# Patient Record
Sex: Female | Born: 1975
Health system: Southern US, Community
[De-identification: ages and names within clinical notes are randomized; demographics above are authoritative.]

## PROBLEM LIST (undated history)

## (undated) DIAGNOSIS — F419 Anxiety disorder, unspecified: Secondary | ICD-10-CM

## (undated) DIAGNOSIS — N200 Calculus of kidney: Secondary | ICD-10-CM

## (undated) DIAGNOSIS — G4730 Sleep apnea, unspecified: Secondary | ICD-10-CM

## (undated) HISTORY — PX: CERVICAL ABLATION: SHX5771

## (undated) HISTORY — PX: FOOT SURGERY: SHX648

## (undated) HISTORY — PX: CHOLECYSTECTOMY: SHX55

## (undated) HISTORY — PX: KIDNEY STONE SURGERY: SHX686

## (undated) HISTORY — DX: Anxiety disorder, unspecified: F41.9

## (undated) HISTORY — DX: Sleep apnea, unspecified: G47.30

## (undated) HISTORY — DX: Calculus of kidney: N20.0

---

## 2001-07-29 ENCOUNTER — Other Ambulatory Visit: Admission: RE | Admit: 2001-07-29 | Discharge: 2001-07-29 | Payer: Self-pay | Admitting: Obstetrics and Gynecology

## 2006-05-08 ENCOUNTER — Emergency Department: Payer: Self-pay | Admitting: Emergency Medicine

## 2006-05-17 ENCOUNTER — Ambulatory Visit: Payer: Self-pay | Admitting: Podiatry

## 2007-11-10 ENCOUNTER — Inpatient Hospital Stay: Payer: Self-pay | Admitting: Unknown Physician Specialty

## 2009-06-18 ENCOUNTER — Emergency Department: Payer: Self-pay | Admitting: Emergency Medicine

## 2012-04-29 HISTORY — PX: TUBAL LIGATION: SHX77

## 2013-05-18 DIAGNOSIS — N938 Other specified abnormal uterine and vaginal bleeding: Secondary | ICD-10-CM | POA: Insufficient documentation

## 2017-04-01 LAB — HM MAMMOGRAPHY: HM Mammogram: NORMAL (ref 0–4)

## 2018-10-08 DIAGNOSIS — Z23 Encounter for immunization: Secondary | ICD-10-CM | POA: Diagnosis not present

## 2018-11-04 ENCOUNTER — Telehealth: Payer: Self-pay | Admitting: Nurse Practitioner

## 2018-11-04 NOTE — Telephone Encounter (Signed)

## 2018-11-05 ENCOUNTER — Encounter: Payer: Self-pay | Admitting: Nurse Practitioner

## 2018-11-05 ENCOUNTER — Other Ambulatory Visit: Payer: Self-pay

## 2018-11-05 ENCOUNTER — Ambulatory Visit (INDEPENDENT_AMBULATORY_CARE_PROVIDER_SITE_OTHER): Payer: BLUE CROSS/BLUE SHIELD | Admitting: Nurse Practitioner

## 2018-11-05 VITALS — BP 112/86 | HR 105 | Temp 97.9°F | Ht 65.0 in | Wt 222.4 lb

## 2018-11-05 DIAGNOSIS — F411 Generalized anxiety disorder: Secondary | ICD-10-CM

## 2018-11-05 MED ORDER — BUSPIRONE HCL 5 MG PO TABS: 5.0000 mg | ORAL_TABLET | Freq: Two times a day (BID) | ORAL | 2 refills | Status: DC

## 2018-11-05 MED ORDER — ESCITALOPRAM OXALATE 10 MG PO TABS: 10.0000 mg | ORAL_TABLET | Freq: Every day | ORAL | 2 refills | Status: DC

## 2018-11-05 NOTE — Progress Notes (Signed)
Subjective:  Patient ID: Regina Schneider, female    DOB: 18-Nov-1975  Age: 43 y.o. MRN: [id number]  CC: Establish Care (est care/Anxiety consult--moved from [place] 3 wks ago--jobs change and alot going on. )  Regina Schneider is here to establish care and to discuss anxiety management. She is married with 2children (74yrs and 41yrs) Last CPE:15months ago Last PAP: 2019 (normal per patient) S/p uterine ablation.  Anxiety Presents for initial visit. Onset was 1 to 5 years ago. The problem has been waxing and waning. Symptoms include confusion, decreased concentration, depressed mood, excessive worry, hyperventilation, insomnia, irritability, malaise, muscle tension, nervous/anxious behavior, palpitations, panic and restlessness. Patient reports no chest pain or suicidal ideas. Symptoms occur most days. The severity of symptoms is causing significant distress and interfering with daily activities. The symptoms are aggravated by family issues and work stress. The quality of sleep is poor. Nighttime awakenings: several, one to two.   Risk factors include marital problems. Her past medical history is significant for anxiety/panic attacks and depression. There is no history of anemia, arrhythmia, asthma, bipolar disorder, CAD, CHF, chronic lung disease, fibromyalgia, hyperthyroidism or suicide attempts. Past treatments include SSRIs, counseling (CBT) and benzodiazephines. The treatment provided significant relief. Compliance with prior treatments has been good.  occasional use of CBD oil at HS Use of alprazolam 0.5mg  prn with moderate relief. Last rx provided by previous pcp in [place] marital discord, asking for separation from her husband. Has a boyfriend. Denies any verbal or physical abuse Started new job as Solicitor. Support system: husband and sister.  Reviewed past Medical, Social and Family history today.  Outpatient Medications Prior to Visit  Medication Sig Dispense Refill  . ALPRAZolam  (XANAX) 0.5 MG tablet Take 0.5 mg by mouth at bedtime as needed for anxiety.     No facility-administered medications prior to visit.    ROS Review of Systems  Constitutional: Positive for irritability and malaise/fatigue. Negative for diaphoresis.  Respiratory: Negative.   Cardiovascular: Positive for palpitations. Negative for chest pain, orthopnea, claudication and leg swelling.  Gastrointestinal: Negative.   Neurological: Negative.   Psychiatric/Behavioral: Positive for confusion, decreased concentration and depression. Negative for hallucinations, memory loss, substance abuse and suicidal ideas. The patient is nervous/anxious and has insomnia.     Objective:  BP 112/86   Pulse (!) 105   Temp 97.9 F (36.6 C) (Tympanic)   Ht 5\' 5"  (1.651 m)   Wt 222 lb 6.4 oz (100.9 kg)   SpO2 96%   BMI 37.01 kg/m   BP Readings from Last 3 Encounters:  11/05/18 112/86    Wt Readings from Last 3 Encounters:  11/05/18 222 lb 6.4 oz (100.9 kg)    Physical Exam Constitutional:      Appearance: She is obese.  Neurological:     Mental Status: She is alert and oriented to person, place, and time.  Psychiatric:        Attention and Perception: Attention normal.        Mood and Affect: Mood is anxious.        Speech: Speech normal.        Behavior: Behavior is cooperative.        Thought Content: Thought content does not include homicidal or suicidal ideation. Thought content does not include homicidal or suicidal plan.        Cognition and Memory: Cognition normal.        Judgment: Judgment normal.    No results found for: WBC, HGB,  HCT, PLT, GLUCOSE, CHOL, TRIG, HDL, LDLDIRECT, LDLCALC, ALT, AST, NA, K, CL, CREATININE, BUN, CO2, TSH, PSA, INR, GLUF, HGBA1C, MICROALBUR  Assessment & Plan:   Regina Schneider was seen today for establish care.  Diagnoses and all orders for this visit:  GAD (generalized anxiety disorder) -     escitalopram (LEXAPRO) 10 MG tablet; Take 1 tablet (10 mg total) by  mouth daily. -     busPIRone (BUSPAR) 5 MG tablet; Take 1 tablet (5 mg total) by mouth 2 (two) times daily.   I am having Regina Schneider. Regina Schneider start on escitalopram and busPIRone. I am also having her maintain her ALPRAZolam.  Meds ordered this encounter  Medications  . escitalopram (LEXAPRO) 10 MG tablet    Sig: Take 1 tablet (10 mg total) by mouth daily.    Dispense:  30 tablet    Refill:  2    Order Specific Question:   Supervising Provider    Answer:   Dianne Dun [[id number]]  . busPIRone (BUSPAR) 5 MG tablet    Sig: Take 1 tablet (5 mg total) by mouth 2 (two) times daily.    Dispense:  30 tablet    Refill:  2    Order Specific Question:   Supervising Provider    Answer:   Dianne Dun [[id number]]    Problem List Items Addressed This Visit    None    Visit Diagnoses    GAD (generalized anxiety disorder)    -  Primary   Relevant Medications   ALPRAZolam (XANAX) 0.5 MG tablet   escitalopram (LEXAPRO) 10 MG tablet   busPIRone (BUSPAR) 5 MG tablet       Follow-up: Return in about 2 weeks (around 11/19/2018) for anxiety ( ).  Alysia Penna, NP

## 2018-11-05 NOTE — Patient Instructions (Addendum)
I instructed pt to start lexapro1/2 tablet once daily for 1 week and then increase to a full tablet once daily on week two as tolerated.   Start buspar 1tab BID We discussed common side effects such as nausea, drowsiness and weight gain.  Also discussed rare but serious side effect of suicide ideation.  She is instructed to discontinue medication go directly to ED if this occurs.  Pt verbalizes understanding.   Plan follow up in 2weeks to evaluate progress.    Schedule appt with EAP counselor as soon as possible.  We will obtain records from previous pcp.  Living With Anxiety  After being diagnosed with an anxiety disorder, you may be relieved to know why you have felt or behaved a certain way. It is natural to also feel overwhelmed about the treatment ahead and what it will mean for your life. With care and support, you can manage this condition and recover from it. How to cope with anxiety Dealing with stress Stress is your body's reaction to life changes and events, both good and bad. Stress can last just a few hours or it can be ongoing. Stress can play a major role in anxiety, so it is important to learn both how to cope with stress and how to think about it differently. Talk with your health care provider or a counselor to learn more about stress reduction. He or she may suggest some stress reduction techniques, such as:  Music therapy. This can include creating or listening to music that you enjoy and that inspires you.  Mindfulness-based meditation. This involves being aware of your normal breaths, rather than trying to control your breathing. It can be done while sitting or walking.  Centering prayer. This is a kind of meditation that involves focusing on a word, phrase, or sacred image that is meaningful to you and that brings you peace.  Deep breathing. To do this, expand your stomach and inhale slowly through your nose. Hold your breath for 3-5 seconds. Then exhale slowly, allowing  your stomach muscles to relax.  Self-talk. This is a skill where you identify thought patterns that lead to anxiety reactions and correct those thoughts.  Muscle relaxation. This involves tensing muscles then relaxing them. Choose a stress reduction technique that fits your lifestyle and personality. Stress reduction techniques take time and practice. Set aside 5-15 minutes a day to do them. Therapists can offer training in these techniques. The training may be covered by some insurance plans. Other things you can do to manage stress include:  Keeping a stress diary. This can help you learn what triggers your stress and ways to control your response.  Thinking about how you respond to certain situations. You may not be able to control everything, but you can control your reaction.  Making time for activities that help you relax, and not feeling guilty about spending your time in this way. Therapy combined with coping and stress-reduction skills provides the best chance for successful treatment. Medicines Medicines can help ease symptoms. Medicines for anxiety include:  Anti-anxiety drugs.  Antidepressants.  Beta-blockers. Medicines may be used as the main treatment for anxiety disorder, along with therapy, or if other treatments are not working. Medicines should be prescribed by a health care provider. Relationships Relationships can play a big part in helping you recover. Try to spend more time connecting with trusted friends and family members. Consider going to couples counseling, taking family education classes, or going to family therapy. Therapy can help you  and others better understand the condition. How to recognize changes in your condition Everyone has a different response to treatment for anxiety. Recovery from anxiety happens when symptoms decrease and stop interfering with your daily activities at home or work. This may mean that you will start to:  Have better concentration  and focus.  Sleep better.  Be less irritable.  Have more energy.  Have improved memory. It is important to recognize when your condition is getting worse. Contact your health care provider if your symptoms interfere with home or work and you do not feel like your condition is improving. Where to find help and support: You can get help and support from these sources:  Self-help groups.  Online and [employer].  A trusted spiritual leader.  Couples counseling.  Family education classes.  Family therapy. Follow these instructions at home:  Eat a healthy diet that includes plenty of vegetables, fruits, whole grains, low-fat dairy products, and lean protein. Do not eat a lot of foods that are high in solid fats, added sugars, or salt.  Exercise. Most adults should do the following: ? Exercise for at least 150 minutes each week. The exercise should increase your heart rate and make you sweat (moderate-intensity exercise). ? Strengthening exercises at least twice a week.  Cut down on caffeine, tobacco, alcohol, and other potentially harmful substances.  Get the right amount and quality of sleep. Most adults need 7-9 hours of sleep each night.  Make choices that simplify your life.  Take over-the-counter and prescription medicines only as told by your health care provider.  Avoid caffeine, alcohol, and certain over-the-counter cold medicines. These may make you feel worse. Ask your pharmacist which medicines to avoid.  Keep all follow-up visits as told by your health care provider. This is important. Questions to ask your health care provider  Would I benefit from therapy?  How often should I follow up with a health care provider?  How long do I need to take medicine?  Are there any long-term side effects of my medicine?  Are there any alternatives to taking medicine? Contact a health care provider if:  You have a hard time staying focused or finishing  daily tasks.  You spend many hours a day feeling worried about everyday life.  You become exhausted by worry.  You start to have headaches, feel tense, or have nausea.  You urinate more than normal.  You have diarrhea. Get help right away if:  You have a racing heart and shortness of breath.  You have thoughts of hurting yourself or others. If you ever feel like you may hurt yourself or others, or have thoughts about taking your own life, get help right away. You can go to your nearest emergency department or call:  Your local emergency services (911 in the [place]  A suicide crisis helpline, such as the [place] at (820)690-4249. This is open 24-hours a day. Summary  Taking steps to deal with stress can help calm you.  Medicines cannot cure anxiety disorders, but they can help ease symptoms.  Family, friends, and partners can play a big part in helping you recover from an anxiety disorder. This information is not intended to replace advice given to you by your health care provider. Make sure you discuss any questions you have with your health care provider. Document Released: 12/13/2015 Document Revised: 11/30/2016 Document Reviewed: 12/13/2015 Elsevier Patient Education  2020 [employer].

## 2018-11-11 ENCOUNTER — Encounter: Payer: Self-pay | Admitting: Nurse Practitioner

## 2018-11-11 DIAGNOSIS — N200 Calculus of kidney: Secondary | ICD-10-CM | POA: Insufficient documentation

## 2018-11-25 ENCOUNTER — Other Ambulatory Visit: Payer: Self-pay

## 2018-11-25 ENCOUNTER — Encounter: Payer: Self-pay | Admitting: Family Medicine

## 2018-11-25 ENCOUNTER — Ambulatory Visit (INDEPENDENT_AMBULATORY_CARE_PROVIDER_SITE_OTHER): Payer: BLUE CROSS/BLUE SHIELD | Admitting: Family Medicine

## 2018-11-25 VITALS — BP 110/88 | HR 88 | Temp 98.6°F | Ht 65.0 in | Wt 219.0 lb

## 2018-11-25 DIAGNOSIS — R300 Dysuria: Secondary | ICD-10-CM | POA: Diagnosis not present

## 2018-11-25 DIAGNOSIS — N3090 Cystitis, unspecified without hematuria: Secondary | ICD-10-CM | POA: Insufficient documentation

## 2018-11-25 LAB — POCT URINALYSIS DIPSTICK
Bilirubin, UA: NEGATIVE
Glucose, UA: NEGATIVE
Ketones, UA: NEGATIVE
Nitrite, UA: NEGATIVE
Protein, UA: POSITIVE — AB
Spec Grav, UA: >=1.03 — AB (ref 1.010–1.025)
Urobilinogen, UA: 0.2 E.U./dL
pH, UA: 6 (ref 5.0–8.0)

## 2018-11-25 MED ORDER — CEPHALEXIN 500 MG PO CAPS: 500.0000 mg | ORAL_CAPSULE | Freq: Two times a day (BID) | ORAL | 0 refills | Status: AC

## 2018-11-25 NOTE — Patient Instructions (Signed)
Urinary Tract Infection, Adult A urinary tract infection (UTI) is an infection of any part of the urinary tract. The urinary tract includes:  The kidneys.  The ureters.  The bladder.  The urethra. These organs make, store, and get rid of pee (urine) in the body. What are the causes? This is caused by germs (bacteria) in your genital area. These germs grow and cause swelling (inflammation) of your urinary tract. What increases the risk? You are more likely to develop this condition if:  You have a small, thin tube (catheter) to drain pee.  You cannot control when you pee or poop (incontinence).  You are female, and: ? You use these methods to prevent pregnancy: ? A medicine that kills sperm (spermicide). ? A device that blocks sperm (diaphragm). ? You have low levels of a female hormone (estrogen). ? You are pregnant.  You have genes that add to your risk.  You are sexually active.  You take antibiotic medicines.  You have trouble peeing because of: ? A prostate that is bigger than normal, if you are female. ? A blockage in the part of your body that drains pee from the bladder (urethra). ? A kidney stone. ? A nerve condition that affects your bladder (neurogenic bladder). ? Not getting enough to drink. ? Not peeing often enough.  You have other conditions, such as: ? Diabetes. ? A weak disease-fighting system (immune system). ? Sickle cell disease. ? Gout. ? Injury of the spine. What are the signs or symptoms? Symptoms of this condition include:  Needing to pee right away (urgently).  Peeing often.  Peeing small amounts often.  Pain or burning when peeing.  Blood in the pee.  Pee that smells bad or not like normal.  Trouble peeing.  Pee that is cloudy.  Fluid coming from the vagina, if you are female.  Pain in the belly or lower back. Other symptoms include:  Throwing up (vomiting).  No urge to eat.  Feeling mixed up (confused).  Being tired  and grouchy (irritable).  A fever.  Watery poop (diarrhea). How is this treated? This condition may be treated with:  Antibiotic medicine.  Other medicines.  Drinking enough water. Follow these instructions at home:  Medicines  Take over-the-counter and prescription medicines only as told by your doctor.  If you were prescribed an antibiotic medicine, take it as told by your doctor. Do not stop taking it even if you start to feel better. General instructions  Make sure you: ? Pee until your bladder is empty. ? Do not hold pee for a long time. ? Empty your bladder after sex. ? Wipe from front to back after pooping if you are a female. Use each tissue one time when you wipe.  Drink enough fluid to keep your pee pale yellow.  Keep all follow-up visits as told by your doctor. This is important. Contact a doctor if:  You do not get better after 1-2 days.  Your symptoms go away and then come back. Get help right away if:  You have very bad back pain.  You have very bad pain in your lower belly.  You have a fever.  You are sick to your stomach (nauseous).  You are throwing up. Summary  A urinary tract infection (UTI) is an infection of any part of the urinary tract.  This condition is caused by germs in your genital area.  There are many risk factors for a UTI. These include having a small, thin   tube to drain pee and not being able to control when you pee or poop.  Treatment includes antibiotic medicines for germs.  Drink enough fluid to keep your pee pale yellow. This information is not intended to replace advice given to you by your health care provider. Make sure you discuss any questions you have with your health care provider. Document Released: 06/06/2007 Document Revised: 12/05/2017 Document Reviewed: 06/27/2017 Elsevier Patient Education  2020 [employer] Inc.  

## 2018-11-25 NOTE — Assessment & Plan Note (Signed)
-  History of kidney stones current history not suggestive of this at this time.  -Start cephalexin 500mg  BID x7 -Urine sent for culture and will adjust antibiotics if needed based on results.  -Instructed to call for any new or worsening symptoms.

## 2018-11-25 NOTE — Progress Notes (Signed)
Regina Schneider - 43 y.o. female MRN [id number]  Date of birth: 02/23/1975  Subjective Chief Complaint  Patient presents with  . Urinary Tract Infection    pt started having symptoms about 5 days ago. Burining and pain after urinating, lower back pain. Hx of kidney stones.    HPI Regina Schneider is a 43 y.o. female with history of renal calculi here today with complaint of dysuria.  She reports that current symptoms started about 5 days ago and include burning and pressure after urination, frequency and urgency.  She has mild low back pain.  She denies flank pain, fever, chills, nausea or gross hematuria.  Reports prior history of lithotripsy but states that this "didn't work".  She has not followed up with urology since having this done.  ROS:  A comprehensive ROS was completed and negative except as noted per HPI  Allergies  Allergen Reactions  . Ciprofloxacin     Hot flashes and abd pain  . Sulfa Antibiotics Hives    Past Medical History:  Diagnosis Date  . Anxiety     Past Surgical History:  Procedure Laterality Date  . CERVICAL ABLATION    . CESAREAN SECTION     2  . CHOLECYSTECTOMY    . FOOT SURGERY Right    metatarsal surygery  . KIDNEY STONE SURGERY    . TUBAL LIGATION Bilateral 04/29/2012    Social History   Socioeconomic History  . Marital status: Unknown    Spouse name: Not on file  . Number of children: Not on file  . Years of education: Not on file  . Highest education level: Not on file  Occupational History  . Not on file  Social Needs  . Financial resource strain: Not on file  . Food insecurity    Worry: Not on file    Inability: Not on file  . Transportation needs    Medical: Not on file    Non-medical: Not on file  Tobacco Use  . Smoking status: Current Some Day Smoker    Types: Cigarettes  . Smokeless tobacco: Never Used  . Tobacco comment: 3-4 cigarettes a day  Substance and Sexual Activity  . Alcohol use: Yes    Alcohol/week: 1.0  standard drinks    Types: 1 Glasses of wine per week    Comment: twice a week  . Drug use: Never  . Sexual activity: Not on file  Lifestyle  . Physical activity    Days per week: Not on file    Minutes per session: Not on file  . Stress: Not on file  Relationships  . Social Musician on phone: Not on file    Gets together: Not on file    Attends religious service: Not on file    Active member of club or organization: Not on file    Attends meetings of clubs or organizations: Not on file    Relationship status: Not on file  Other Topics Concern  . Not on file  Social History Narrative  . Not on file    Family History  Problem Relation Age of Onset  . COPD Mother   . Dementia Mother   . Cancer Father        lung and brain stem  . COPD Father   . Cancer Maternal Grandmother        breast cancer, 50  . Diabetes Paternal Grandmother     Health Maintenance  Topic Date Due  .  HIV Screening  04/14/1988  . TETANUS/TDAP  04/14/1992  . PAP SMEAR-Modifier  04/15/1994  . INFLUENZA VACCINE  Completed    ----------------------------------------------------------------------------------------------------------------------------------------------------------------------------------------------------------------- Physical Exam BP 110/88   Pulse 88   Temp 98.6 F (37 C) (Temporal)   Ht 5\' 5"  (1.651 m)   Wt 219 lb (99.3 kg)   SpO2 96%   BMI 36.44 kg/m   Physical Exam Constitutional:      Appearance: Normal appearance.  HENT:     Head: Normocephalic and atraumatic.  Eyes:     General: No scleral icterus. Cardiovascular:     Rate and Rhythm: Normal rate and regular rhythm.  Pulmonary:     Effort: Pulmonary effort is normal.     Breath sounds: Normal breath sounds.  Abdominal:     General: Abdomen is flat. There is no distension.     Palpations: Abdomen is soft.     Tenderness: There is no abdominal tenderness. There is no right CVA tenderness or left CVA  tenderness.  Skin:    General: Skin is warm and dry.  Neurological:     General: No focal deficit present.     Mental Status: She is alert.  Psychiatric:        Mood and Affect: Mood normal.        Behavior: Behavior normal.     ------------------------------------------------------------------------------------------------------------------------------------------------------------------------------------------------------------------- Assessment and Plan  Cystitis -History of kidney stones current history not suggestive of this at this time.  -Start cephalexin 500mg  BID x7 -Urine sent for culture and will adjust antibiotics if needed based on results.  -Instructed to call for any new or worsening symptoms.   This visit occurred during the SARS-CoV-2 public health emergency.  Safety protocols were in place, including screening questions prior to the visit, additional usage of staff PPE, and extensive cleaning of exam room while observing appropriate contact time as indicated for disinfecting solutions.

## 2018-11-27 LAB — URINE CULTURE
MICRO NUMBER:: 1134694
SPECIMEN QUALITY:: ADEQUATE

## 2019-02-24 ENCOUNTER — Telehealth (INDEPENDENT_AMBULATORY_CARE_PROVIDER_SITE_OTHER): Payer: 59 | Admitting: Nurse Practitioner

## 2019-02-24 ENCOUNTER — Encounter: Payer: Self-pay | Admitting: Nurse Practitioner

## 2019-02-24 VITALS — Ht 65.0 in | Wt 220.0 lb

## 2019-02-24 DIAGNOSIS — Z6836 Body mass index (BMI) 36.0-36.9, adult: Secondary | ICD-10-CM

## 2019-02-24 DIAGNOSIS — K2100 Gastro-esophageal reflux disease with esophagitis, without bleeding: Secondary | ICD-10-CM

## 2019-02-24 DIAGNOSIS — F411 Generalized anxiety disorder: Secondary | ICD-10-CM | POA: Diagnosis not present

## 2019-02-24 DIAGNOSIS — E6609 Other obesity due to excess calories: Secondary | ICD-10-CM | POA: Insufficient documentation

## 2019-02-24 MED ORDER — OMEPRAZOLE 20 MG PO CPDR: 20.0000 mg | DELAYED_RELEASE_CAPSULE | Freq: Two times a day (BID) | ORAL | 0 refills | Status: DC

## 2019-02-24 NOTE — Patient Instructions (Signed)
Call office if no improvement in 2weeks.  Food Choices for Gastroesophageal Reflux Disease, Adult When you have gastroesophageal reflux disease (GERD), the foods you eat and your eating habits are very important. Choosing the right foods can help ease your discomfort. Think about working with a nutrition specialist (dietitian) to help you make good choices. What are tips for following this plan?  Meals  Choose healthy foods that are low in fat, such as fruits, vegetables, whole grains, low-fat dairy products, and lean meat, fish, and poultry.  Eat small meals often instead of 3 large meals a day. Eat your meals slowly, and in a place where you are relaxed. Avoid bending over or lying down until 2-3 hours after eating.  Avoid eating meals 2-3 hours before bed.  Avoid drinking a lot of liquid with meals.  Cook foods using methods other than frying. Bake, grill, or broil food instead.  Avoid or limit: ? Chocolate. ? Peppermint or spearmint. ? Alcohol. ? Pepper. ? Black and decaffeinated coffee. ? Black and decaffeinated tea. ? Bubbly (carbonated) soft drinks. ? Caffeinated energy drinks and soft drinks.  Limit high-fat foods such as: ? Fatty meat or fried foods. ? Whole milk, cream, butter, or ice cream. ? Nuts and nut butters. ? Pastries, donuts, and sweets made with butter or shortening.  Avoid foods that cause symptoms. These foods may be different for everyone. Common foods that cause symptoms include: ? Tomatoes. ? Oranges, lemons, and limes. ? Peppers. ? Spicy food. ? Onions and garlic. ? Vinegar. Lifestyle  Maintain a healthy weight. Ask your doctor what weight is healthy for you. If you need to lose weight, work with your doctor to do so safely.  Exercise for at least 30 minutes for 5 or more days each week, or as told by your doctor.  Wear loose-fitting clothes.  Do not smoke. If you need help quitting, ask your doctor.  Sleep with the head of your bed higher  than your feet. Use a wedge under the mattress or blocks under the bed frame to raise the head of the bed. Summary  When you have gastroesophageal reflux disease (GERD), food and lifestyle choices are very important in easing your symptoms.  Eat small meals often instead of 3 large meals a day. Eat your meals slowly, and in a place where you are relaxed.  Limit high-fat foods such as fatty meat or fried foods.  Avoid bending over or lying down until 2-3 hours after eating.  Avoid peppermint and spearmint, caffeine, alcohol, and chocolate. This information is not intended to replace advice given to you by your health care provider. Make sure you discuss any questions you have with your health care provider. Document Revised: 04/10/2018 Document Reviewed: 01/24/2016 Elsevier Patient Education  2020 [employer].

## 2019-02-24 NOTE — Assessment & Plan Note (Signed)
Ongoing CBT with therapist through [facility], sessions 1-2x/week Stable mood, states she no longer needs lexapro and buspar. Use of alprazolam 0.5mg  prn, Rx from previous pcp in [place], last filled 09/2018, #30tabs. I advised her that I do not recommend long term use of benzodiazepine. I recommended use of buspar prn and/or resuming lexapro if mood worsens. F/up in 67month

## 2019-02-24 NOTE — Progress Notes (Signed)
Virtual Visit via Video Note  I connected with@ on 02/24/19 at 12:30 PM EST by a video enabled telemedicine application and verified that I am speaking with the correct person using two identifiers.  Location: Patient:Home Provider: Office Participants: patient and provider   I discussed the limitations of evaluation and management by telemedicine and the availability of in person appointments. I also discussed with the patient that there may be a patient responsible charge related to this service. The patient expressed understanding and agreed to proceed.  [id number]  History of Present Illness: Gastroesophageal Reflux She complains of abdominal pain, globus sensation, heartburn and nausea. She reports no belching, no chest pain, no choking, no coughing, no dysphagia, no hoarse voice, no sore throat, no stridor, no tooth decay, no water brash or no wheezing. This is a new problem. The current episode started 1 to 4 weeks ago. The problem occurs constantly. The problem has been unchanged. The heartburn is located in the substernum. The heartburn does not wake her from sleep. The heartburn does not limit her activity. The heartburn doesn't change with position. The symptoms are aggravated by certain foods and stress. Pertinent negatives include no anemia, fatigue, melena, muscle weakness, orthopnea or weight loss. Risk factors include obesity. She has tried a diet change for the symptoms. The treatment provided mild relief.  S/p cholecystectomy 36yrs  She is also requesting referral to weight loss clinic. Previous use of phentermine last year, lost 20lbs but unable to tolerate side effects (insomnia and increased anxiety).   Observations/Objective: Unable to provide any vital signs Physical Exam  Constitutional: She is oriented to person, place, and time. No distress.  Pulmonary/Chest: Effort normal.  Neurological: She is alert and oriented to person, place, and time.   Assessment and  Plan: Diagnoses and all orders for this visit:  Gastroesophageal reflux disease with esophagitis without hemorrhage -     omeprazole (PRILOSEC) 20 MG capsule; Take 1 capsule (20 mg total) by mouth 2 (two) times daily before a meal.  Class 2 severe obesity due to excess calories with serious comorbidity and body mass index (BMI) of 36.0 to 36.9 in adult (HCC) -     Amb Ref to Medical Weight Management  GAD (generalized anxiety disorder)   Follow Up Instructions: See avs   I discussed the assessment and treatment plan with the patient. The patient was provided an opportunity to ask questions and all were answered. The patient agreed with the plan and demonstrated an understanding of the instructions.   The patient was advised to call back or seek an in-person evaluation if the symptoms worsen or if the condition fails to improve as anticipated.  Alysia Penna, NP

## 2019-03-04 ENCOUNTER — Ambulatory Visit: Payer: 59 | Admitting: Dietician

## 2019-05-02 LAB — HM PAP SMEAR: HM Pap smear: NORMAL

## 2019-06-19 ENCOUNTER — Other Ambulatory Visit: Payer: Self-pay | Admitting: Emergency Medicine

## 2019-06-19 ENCOUNTER — Ambulatory Visit
Admission: RE | Admit: 2019-06-19 | Discharge: 2019-06-19 | Disposition: A | Discharge status: Home / Self Care | Payer: BC Managed Care – PPO | Source: Ambulatory Visit | Attending: Emergency Medicine | Admitting: Emergency Medicine

## 2019-06-19 ENCOUNTER — Ambulatory Visit
Admission: RE | Admit: 2019-06-19 | Discharge: 2019-06-19 | Disposition: A | Discharge status: Home / Self Care | Payer: Worker's Compensation | Attending: Emergency Medicine | Admitting: Emergency Medicine

## 2019-06-19 DIAGNOSIS — W109XXS Fall (on) (from) unspecified stairs and steps, sequela: Secondary | ICD-10-CM

## 2019-06-19 DIAGNOSIS — Y990 Civilian activity done for income or pay: Secondary | ICD-10-CM | POA: Diagnosis not present

## 2019-06-19 DIAGNOSIS — W109XXA Fall (on) (from) unspecified stairs and steps, initial encounter: Secondary | ICD-10-CM | POA: Insufficient documentation

## 2019-06-19 DIAGNOSIS — S42295A Other nondisplaced fracture of upper end of left humerus, initial encounter for closed fracture: Secondary | ICD-10-CM | POA: Insufficient documentation

## 2019-06-19 DIAGNOSIS — G8911 Acute pain due to trauma: Secondary | ICD-10-CM | POA: Diagnosis not present

## 2019-06-21 ENCOUNTER — Emergency Department: Admission: EM | Admit: 2019-06-21 | Discharge: 2019-06-21 | Discharge status: Against Medical Advice | Payer: Self-pay

## 2019-06-24 ENCOUNTER — Other Ambulatory Visit: Payer: Self-pay

## 2019-06-24 ENCOUNTER — Ambulatory Visit: Admission: RE | Admit: 2019-06-24 | Discharge status: Absent without leave | Payer: Self-pay | Source: Ambulatory Visit

## 2019-06-24 ENCOUNTER — Ambulatory Visit
Admission: EM | Admit: 2019-06-24 | Discharge: 2019-06-24 | Disposition: A | Discharge status: Home / Self Care | Payer: BC Managed Care – PPO | Attending: Emergency Medicine | Admitting: Emergency Medicine

## 2019-06-24 DIAGNOSIS — R300 Dysuria: Secondary | ICD-10-CM | POA: Diagnosis not present

## 2019-06-24 LAB — POCT URINALYSIS DIP (MANUAL ENTRY)
Bilirubin, UA: NEGATIVE
Glucose, UA: NEGATIVE mg/dL
Ketones, POC UA: NEGATIVE mg/dL
Nitrite, UA: NEGATIVE
Protein Ur, POC: 100 mg/dL — AB
Spec Grav, UA: >=1.03 — AB (ref 1.010–1.025)
Urobilinogen, UA: 1 E.U./dL
pH, UA: 6 (ref 5.0–8.0)

## 2019-06-24 MED ORDER — CEPHALEXIN 500 MG PO CAPS: 500.0000 mg | ORAL_CAPSULE | Freq: Two times a day (BID) | ORAL | 0 refills | Status: AC

## 2019-06-24 NOTE — Discharge Instructions (Signed)
A urine culture is pending.  We will call you if your antibiotic needs to be changed or discontinued.    Take the cephalexin as directed.    Follow-up with your primary care provider if your symptoms are not improving.

## 2019-06-24 NOTE — ED Triage Notes (Signed)
Patient states that she fell last week on Friday and broke her arm. Patient states that she started having Urinary frequency, dysuria and back pain that started 1 day after she fell. States that she is on Tylenol with Codeine right now and she is not able to get her pain under control with this. States that she has had a kidney stone for a while that was unsuccessful with lithotripsy and she thinks when she fell she dislodged it.

## 2019-06-24 NOTE — ED Provider Notes (Signed)
Roderic Palau    CSN: [id number] Arrival date & time: 06/24/19  1707      History   Chief Complaint Chief Complaint  Patient presents with  . Urinary Frequency    HPI ANALAYAH BROOKE is a 44 y.o. female.   Patient presents with 1 week history of dysuria, frequency, urgency.  Patient states she has a history of kidney stones and is concerned that she may have "dislodged one" when she fell last week.  She states she broke her arm when she fell and is taking Tylenol with codeine.  She states the Tylenol with codeine is not relieving her urinary discomfort.  She denies fever, chills, abdominal pain, vaginal discharge, pelvic pain, or other symptoms.  The history is provided by the patient.    Past Medical History:  Diagnosis Date  . Anxiety     Patient Active Problem List   Diagnosis Date Noted  . GAD (generalized anxiety disorder) 02/24/2019  . Class 2 severe obesity due to excess calories with serious comorbidity and body mass index (BMI) of 36.0 to 36.9 in adult [facility]) 02/24/2019  . Cystitis 11/25/2018  . Renal calculi 11/11/2018  . DUB (dysfunctional uterine bleeding) 05/18/2013    Past Surgical History:  Procedure Laterality Date  . CERVICAL ABLATION    . CESAREAN SECTION     2  . CHOLECYSTECTOMY    . FOOT SURGERY Right    metatarsal surygery  . KIDNEY STONE SURGERY    . TUBAL LIGATION Bilateral 04/29/2012    OB History   No obstetric history on file.      Home Medications    Prior to Admission medications   Medication Sig Start Date End Date Taking? Authorizing Provider  acetaminophen-codeine (TYLENOL #4) 300-60 MG tablet Take 1 tablet by mouth every 4 (four) hours as needed for pain.   Yes [provider]  valACYclovir (VALTREX) 1000 MG tablet To take BID x 7 days as needed 07/23/18  Yes [provider]  ALPRAZolam Duanne Moron) 0.5 MG tablet Take 0.5 mg by mouth at bedtime as needed for anxiety.    [provider]    cephALEXin (KEFLEX) 500 MG capsule Take 1 capsule (500 mg total) by mouth 2 (two) times daily for 5 days. 06/24/19 06/29/19  Sharion Balloon, NP  omeprazole (PRILOSEC) 20 MG capsule Take 1 capsule (20 mg total) by mouth 2 (two) times daily before a meal. 02/24/19   Nche, Charlene Brooke, NP    Family History Family History  Problem Relation Age of Onset  . COPD Mother   . Dementia Mother   . Cancer Father        lung and brain stem  . COPD Father   . Cancer Maternal Grandmother        breast cancer, 6  . Diabetes Paternal Grandmother     Social History Social History   Tobacco Use  . Smoking status: Former Smoker    Types: Cigarettes    Quit date: 12/02/2018    Years since quitting: 0.5  . Smokeless tobacco: Never Used  . Tobacco comment: 3-4 cigarettes a day  Vaping Use  . Vaping Use: Never used  Substance Use Topics  . Alcohol use: Yes    Alcohol/week: 1.0 standard drink    Types: 1 Glasses of wine per week    Comment: twice a week  . Drug use: Never     Allergies   Ciprofloxacin and Sulfa antibiotics   Review of  Systems Review of Systems  Constitutional: Negative for chills and fever.  HENT: Negative for ear pain and sore throat.   Eyes: Negative for pain and visual disturbance.  Respiratory: Negative for cough and shortness of breath.   Cardiovascular: Negative for chest pain and palpitations.  Gastrointestinal: Negative for abdominal pain, diarrhea, nausea and vomiting.  Genitourinary: Positive for dysuria, frequency and urgency. Negative for hematuria.  Musculoskeletal: Negative for gait problem and neck pain.  Skin: Negative for color change and rash.  Neurological: Negative for seizures and syncope.  All other systems reviewed and are negative.    Physical Exam Triage Vital Signs ED Triage Vitals [06/24/19 1725]  Enc Vitals Group     BP      Pulse      Resp      Temp      Temp src      SpO2      Weight 210 lb (95.3 kg)     Height 5\' 5"  (1.651 m)      Head Circumference      Peak Flow      Pain Score 9     Pain Loc      Pain Edu?      Excl. in GC?    No data found.  Updated Vital Signs BP 133/81 (BP Location: Right Arm)   Pulse 97   Temp 98.6 F (37 C) (Oral)   Resp 18   Ht 5\' 5"  (1.651 m)   Wt 210 lb (95.3 kg)   SpO2 98%   BMI 34.95 kg/m   Visual Acuity Right Eye Distance:   Left Eye Distance:   Bilateral Distance:    Right Eye Near:   Left Eye Near:    Bilateral Near:     Physical Exam Vitals and nursing note reviewed.  Constitutional:      General: She is not in acute distress.    Appearance: She is well-developed. She is not ill-appearing.  HENT:     Head: Normocephalic and atraumatic.     Mouth/Throat:     Mouth: Mucous membranes are moist.  Eyes:     Conjunctiva/sclera: Conjunctivae normal.  Cardiovascular:     Rate and Rhythm: Normal rate and regular rhythm.     Heart sounds: No murmur heard.   Pulmonary:     Effort: Pulmonary effort is normal. No respiratory distress.     Breath sounds: Normal breath sounds.  Abdominal:     Palpations: Abdomen is soft.     Tenderness: There is no abdominal tenderness. There is no right CVA tenderness, left CVA tenderness, guarding or rebound.  Musculoskeletal:     Cervical back: Neck supple.  Skin:    General: Skin is warm and dry.     Findings: No rash.  Neurological:     General: No focal deficit present.     Mental Status: She is alert and oriented to person, place, and time.     Gait: Gait normal.  Psychiatric:        Mood and Affect: Mood normal.        Behavior: Behavior normal.      UC Treatments / Results  Labs (all labs ordered are listed, but only abnormal results are displayed) Labs Reviewed  POCT URINALYSIS DIP (MANUAL ENTRY) - Abnormal; Notable for the following components:      Result Value   Clarity, UA cloudy (*)    Spec Grav, UA >=1.030 (*)    Blood, UA large (*)  Protein Ur, POC =100 (*)    Leukocytes, UA Small (1+) (*)     All other components within normal limits  URINE CULTURE    EKG   Radiology No results found.  Procedures Procedures (including critical care time)  Medications Ordered in UC Medications - No data to display  Initial Impression / Assessment and Plan / UC Course  I have reviewed the triage vital signs and the nursing notes.  Pertinent labs & imaging results that were available during my care of the patient were reviewed by me and considered in my medical decision making (see chart for details).   Dysuria.  Urine culture pending.  Treating with Keflex.  Instructed patient that we will call her if her antibiotic needs to be changed or discontinued.  Instructed her to follow-up with her PCP if her symptoms or not improving.  Instructed her to go to the ED if she has uncontrolled pain or worsening symptoms.  Patient agrees to plan of care.     Final Clinical Impressions(s) / UC Diagnoses   Final diagnoses:  Dysuria     Discharge Instructions     A urine culture is pending.  We will call you if your antibiotic needs to be changed or discontinued.    Take the cephalexin as directed.    Follow-up with your primary care provider if your symptoms are not improving.        ED Prescriptions    Medication Sig Dispense Auth. Provider   cephALEXin (KEFLEX) 500 MG capsule Take 1 capsule (500 mg total) by mouth 2 (two) times daily for 5 days. 10 capsule Mickie Bail, NP     I have reviewed the PDMP during this encounter.   Mickie Bail, NP 06/24/19 1758

## 2019-06-26 LAB — URINE CULTURE

## 2019-07-01 ENCOUNTER — Ambulatory Visit (INDEPENDENT_AMBULATORY_CARE_PROVIDER_SITE_OTHER): Payer: BC Managed Care – PPO | Admitting: Family Medicine

## 2019-07-01 ENCOUNTER — Encounter: Payer: Self-pay | Admitting: Family Medicine

## 2019-07-01 ENCOUNTER — Other Ambulatory Visit: Payer: Self-pay

## 2019-07-01 VITALS — BP 113/68 | HR 91 | Temp 98.6°F | Ht 65.0 in | Wt 227.0 lb

## 2019-07-01 DIAGNOSIS — G4730 Sleep apnea, unspecified: Secondary | ICD-10-CM | POA: Insufficient documentation

## 2019-07-01 DIAGNOSIS — Z6837 Body mass index (BMI) 37.0-37.9, adult: Secondary | ICD-10-CM

## 2019-07-01 DIAGNOSIS — Z7689 Persons encountering health services in other specified circumstances: Secondary | ICD-10-CM | POA: Insufficient documentation

## 2019-07-01 DIAGNOSIS — F419 Anxiety disorder, unspecified: Secondary | ICD-10-CM | POA: Insufficient documentation

## 2019-07-01 NOTE — Assessment & Plan Note (Addendum)
New patient establishment at [facility]  Will f/u 4 weeks for CPE and labs

## 2019-07-01 NOTE — Assessment & Plan Note (Signed)
Reported history of OSA, requesting to work on weight loss before having to have repeat sleep study with CPAP.  Plan to lose 30-50lbs and re-evaluate OSA.

## 2019-07-01 NOTE — Patient Instructions (Signed)
A referral to OB/GYN, [facility], Dr. Tiburcio Pea, has been placed today.  If you have not heard from the specialty office or our referral coordinator within 1 week, please let us know and we will follow up with the referral coordinator for an update.  We will plan to see you back in 4 weeks for physical  You will receive a survey after today's visit either digitally by e-mail or paper by USPS mail. Your experiences and feedback matter to [place].  Please respond so we know how we are doing as we provide care for you.  Call us with any questions/concerns/needs.  It is my goal to be available to you for your health concerns.  Thanks for choosing me to be a partner in your healthcare needs!  Charlaine Dalton, FNP-C Family Nurse Practitioner [facility] Phone: [phone]

## 2019-07-01 NOTE — Assessment & Plan Note (Signed)
Discussed weight management options within our clinic, such as Gwenith Daily, Topamax, Saxenda, and Victoza.  Patient has had previous weight loss with phenteramine and discussed we do not prescribe that in our clinic.  Discussed can discuss with her OB/GYN at her re-establish visit and if is not part of their practice, we can submit a prescription for Contrave for her.  Assisted with looking into [facility] for weight loss support and forming healthy relationship with food.  Patient agreeable to plan.

## 2019-07-01 NOTE — Progress Notes (Signed)
Subjective:    Patient ID: Regina Schneider, female    DOB: [birth year], 44 y.o.   MRN: [id number]  Regina Schneider is a 44 y.o. female presenting on 07/01/2019 for Establish Care and Weight Loss   HPI  Previous PCP was at [facility].  Records will not be requested, as are in CareEverywhere.  Past medical, family, and surgical history reviewed w/ pt.  Ms. Stratmann presents to clinic for establishment of care.  Has acute concerns today to discuss weight loss needs.  Had previously taken phenteramine and is looking to restart.  Has recently had a left humeral fracture, that is causing her to be less mobile and is looking to jump start weight loss.  Is also requesting referral to re-establish with previous OB/GYN with [facility].    Depression screen PHQ 2/9 11/05/2018  Decreased Interest 2  Down, Depressed, Hopeless 3  PHQ - 2 Score 5  Altered sleeping 3  Tired, decreased energy 2  Change in appetite 2  Feeling bad or failure about yourself  3  Trouble concentrating 3  Moving slowly or fidgety/restless 3  Suicidal thoughts 0  PHQ-9 Score 21    Social History   Tobacco Use  . Smoking status: Former Smoker    Types: Cigarettes    Quit date: 12/02/2018    Years since quitting: 0.5  . Smokeless tobacco: Never Used  . Tobacco comment: 3-4 cigarettes a day  Vaping Use  . Vaping Use: Never used  Substance Use Topics  . Alcohol use: Yes    Alcohol/week: 1.0 standard drink    Types: 1 Glasses of wine per week    Comment: twice a week  . Drug use: Never    Review of Systems  Constitutional: Negative.   HENT: Negative.   Eyes: Negative.   Respiratory: Negative.   Cardiovascular: Negative.   Gastrointestinal: Negative.   Endocrine: Negative.   Genitourinary: Negative.   Musculoskeletal: Negative.   Skin: Negative.   Allergic/Immunologic: Negative.   Neurological: Negative.   Hematological: Negative.   Psychiatric/Behavioral: Negative.    Per HPI unless  specifically indicated above     Objective:    BP 113/68   Pulse 91   Temp 98.6 F (37 C) (Oral)   Ht 5\' 5"  (1.651 m)   Wt 227 lb (103 kg)   SpO2 98%   BMI 37.77 kg/m   Wt Readings from Last 3 Encounters:  07/01/19 227 lb (103 kg)  06/24/19 210 lb (95.3 kg)  02/24/19 220 lb (99.8 kg)    Physical Exam Vitals reviewed.  Constitutional:      General: She is not in acute distress.    Appearance: Normal appearance. She is well-developed and well-groomed. She is obese. She is not ill-appearing or toxic-appearing.  HENT:     Head: Normocephalic and atraumatic.     Nose:     Comments: 02/26/19 is in place, covering mouth and nose. Eyes:     General: Lids are normal. Vision grossly intact.        Right eye: No discharge.        Left eye: No discharge.     Extraocular Movements: Extraocular movements intact.     Conjunctiva/sclera: Conjunctivae normal.     Pupils: Pupils are equal, round, and reactive to light.  Cardiovascular:     Rate and Rhythm: Normal rate and regular rhythm.     Pulses: Normal pulses.  Dorsalis pedis pulses are 2+ on the right side and 2+ on the left side.     Heart sounds: Normal heart sounds. No murmur heard.  No friction rub. No gallop.   Pulmonary:     Effort: Pulmonary effort is normal. No respiratory distress.     Breath sounds: Normal breath sounds.  Musculoskeletal:     Right lower leg: No edema.     Left lower leg: No edema.     Comments: Left arm in immobilizer  Skin:    General: Skin is warm and dry.     Capillary Refill: Capillary refill takes less than 2 seconds.  Neurological:     General: No focal deficit present.     Mental Status: She is alert and oriented to person, place, and time.  Psychiatric:        Attention and Perception: Attention and perception normal.        Mood and Affect: Mood and affect normal.        Speech: Speech normal.        Behavior: Behavior normal. Behavior is cooperative.        Thought Content:  Thought content normal.        Cognition and Memory: Cognition and memory normal.        Judgment: Judgment normal.    Results for orders placed or performed in visit on 07/01/19  HM MAMMOGRAPHY  Result Value Ref Range   HM Mammogram Self Reported Normal 0-4 Bi-Rad, Self Reported Normal  HM PAP SMEAR  Result Value Ref Range   HM Pap smear self reported normal       Assessment & Plan:   Problem List Items Addressed This Visit      Respiratory   Sleep apnea    Reported history of OSA, requesting to work on weight loss before having to have repeat sleep study with CPAP.  Plan to lose 30-50lbs and re-evaluate OSA.        Other   Encounter to establish care with new doctor - Primary    New patient establishment at [facility]  Will f/u 4 weeks for CPE and labs       Relevant Orders   Ambulatory referral to Obstetrics / Gynecology   BMI 37.0-37.9, adult    Discussed weight management options within our clinic, such as Gwenith Daily, Topamax, Saxenda, and Victoza.  Patient has had previous weight loss with phenteramine and discussed we do not prescribe that in our clinic.  Discussed can discuss with her OB/GYN at her re-establish visit and if is not part of their practice, we can submit a prescription for Contrave for her.  Assisted with looking into [facility] for weight loss support and forming healthy relationship with food.  Patient agreeable to plan.          No orders of the defined types were placed in this encounter.     Follow up plan: Return in about 4 weeks (around 07/29/2019) for CPE.   Charlaine Dalton, FNP Family Nurse Practitioner [facility] 07/01/2019, 10:59 AM

## 2019-07-02 ENCOUNTER — Telehealth: Payer: Self-pay | Admitting: Obstetrics & Gynecology

## 2019-07-02 NOTE — Telephone Encounter (Signed)
Regina Schneider Medical referring for Relocated back to area, looking to re-establish with [facility]. Called and left voicemail for patient to call back to be scheduled.

## 2019-07-08 ENCOUNTER — Telehealth: Payer: Self-pay

## 2019-07-08 NOTE — Telephone Encounter (Signed)
Copied from CRM [id number]. Topic: General - Inquiry >> Jul 08, 2019  1:34 PM Regina Schneider wrote: Reason for CRM: Pt stated that she seen her PCP [facility] about a week ago, spoke of dieting plans, PT stated that she wanted  to see about using the other RX that was talked about on last visit   I spoke with the patient and she informed me that she would like  to try the contrave, because she have taking the phentermine in the past and it makes her jittery. She is still planning to keep her appt with Dr. Tiburcio Pea for  GYN concerns only.

## 2019-07-09 ENCOUNTER — Other Ambulatory Visit: Payer: Self-pay | Admitting: Family Medicine

## 2019-07-09 DIAGNOSIS — Z6837 Body mass index (BMI) 37.0-37.9, adult: Secondary | ICD-10-CM

## 2019-07-09 MED ORDER — NALTREXONE-BUPROPION HCL ER 8-90 MG PO TB12: ORAL_TABLET | ORAL | 0 refills | Status: DC

## 2019-07-09 NOTE — Progress Notes (Signed)
Patient requesting Contrave Rx - will send in rx to pharmacy on file

## 2019-07-09 NOTE — Telephone Encounter (Signed)
She want to have you to manage her weight loss. She said she will still keep her appt with Dr. Tiburcio Pea for GYN consult only.

## 2019-07-09 NOTE — Telephone Encounter (Signed)
When we met, patient was interested in re-establishing with Dr. Harris and discussing about starting back on phenteramine since we do not prescribe in our clinic.  She has an appt with him on 08/04/19.  We spoke about sending in a prescription for contrave.  She would need to decide who would be the provider who was working on her weight management before I sent in a prescription. 

## 2019-07-09 NOTE — Telephone Encounter (Signed)
Can you find out if she wants to have weight loss managed here in primary care or with Dr. Tiburcio Pea?  She has an appt with him 08/04/19

## 2019-07-17 ENCOUNTER — Encounter: Payer: Self-pay | Admitting: Family Medicine

## 2019-07-17 ENCOUNTER — Other Ambulatory Visit: Payer: Self-pay

## 2019-07-17 ENCOUNTER — Ambulatory Visit (INDEPENDENT_AMBULATORY_CARE_PROVIDER_SITE_OTHER): Payer: BC Managed Care – PPO | Admitting: Family Medicine

## 2019-07-17 VITALS — BP 130/62 | HR 88 | Temp 98.2°F | Resp 16 | Ht 65.0 in | Wt 226.6 lb

## 2019-07-17 DIAGNOSIS — N200 Calculus of kidney: Secondary | ICD-10-CM

## 2019-07-17 DIAGNOSIS — N3001 Acute cystitis with hematuria: Secondary | ICD-10-CM | POA: Diagnosis not present

## 2019-07-17 DIAGNOSIS — R319 Hematuria, unspecified: Secondary | ICD-10-CM | POA: Diagnosis not present

## 2019-07-17 DIAGNOSIS — N390 Urinary tract infection, site not specified: Secondary | ICD-10-CM | POA: Diagnosis not present

## 2019-07-17 DIAGNOSIS — N23 Unspecified renal colic: Secondary | ICD-10-CM | POA: Diagnosis not present

## 2019-07-17 LAB — POCT URINALYSIS DIPSTICK
Bilirubin, UA: NEGATIVE
Glucose, UA: NEGATIVE
Ketones, UA: NEGATIVE
Nitrite, UA: POSITIVE
Protein, UA: POSITIVE — AB
Spec Grav, UA: 1.02 (ref 1.010–1.025)
Urobilinogen, UA: 0.2 E.U./dL
pH, UA: 5 (ref 5.0–8.0)

## 2019-07-17 MED ORDER — CEPHALEXIN 500 MG PO CAPS: 500.0000 mg | ORAL_CAPSULE | Freq: Three times a day (TID) | ORAL | 0 refills | Status: DC

## 2019-07-17 MED ORDER — CYCLOBENZAPRINE HCL 10 MG PO TABS: 10.0000 mg | ORAL_TABLET | Freq: Three times a day (TID) | ORAL | 0 refills | Status: DC | PRN

## 2019-07-17 NOTE — Patient Instructions (Addendum)
Thank you for coming to the office today.  You most likely have a Kidney Stone (Nephrolithiasis)  It can cause flank or side pain, abdominal pain, or pain with urination. Also blood in urine is very common.  You have been referred to Urology  Very important to see Urologist as scheduled and discuss removal options and they may repeat imaging  If you get any of the following it is more of an emergency and may need to go to hospital directly or we can try to contact Urology sooner  - Fever, chills sweats nausea vomiting cannot tolerate antibiotic - Cannot pee or void at all for 12 hours straight, despite straining and medicine  I am concerned about possible UTI or bladder infection, you do not have symptoms of a kidney infection at this time.  We will send urine for culture - stay tuned for results within 48 hours or so, if we need to adjust antibiotic  Start Keflex antibiotic 3 times daily for 7 days - finish entire course  [facility] -1st floor [address] [place],  [place]  [address] Phone: [phone]     Please schedule a Follow-up Appointment to: Return if symptoms worsen or fail to improve.  If you have any other questions or concerns, please feel free to call the office or send a message through MyChart. You may also schedule an earlier appointment if necessary.  Additionally, you may be receiving a survey about your experience at our office within a few days to 1 week by e-mail or mail. We value your feedback.  Saralyn Pilar, DO [facility] [place]

## 2019-07-17 NOTE — Progress Notes (Signed)
Subjective:    Patient ID: Regina Schneider, female    DOB: [birth year], 45 y.o.   MRN: [id number]  Regina Schneider is a 44 y.o. female presenting on 07/17/2019 for Urinary Tract Infection (went to Urgent care --got improved but not has frequencing, burning, abdominal pain and back pain)  PCP is Danielle Rankin, FNP   HPI   UTI, recurrent / Nephrolithiasis / pelvic vs Flank Pain  Recent Urgent Care visit 06/24/19, [facility], had 1 week dysuria UTI symptoms, frequency, urgency. History of kidney stones. She had UA positive and Urine Culture with abnormal multiple species present, given Keflex 500mg  BID for 5 days.  History of 2.5 years ago in with Urology evaluation for kidney stones, prior of known smaller 43mm L nephrolithiasis, identified back in [place]. She has had Urology do the lithotripsy at that time that was unsuccessful. - She has passed smaller stones. But none recently. She is interested to see a Urologist now locally  Since leaving Urgent Care recently 6/23, she completed the 5 day Keflex, and her symptoms significantly improved. However now several weeks later she has had some episodic worsening, lower abdominal or pelvic pain, and bilateral back pain. Her urinary symptoms have returned with urinary frequency, dysuria.  She has upcoming scheduled apt with [facility] 08/04/19. History of cervical ablation in past, questioning source of current bleeding.  Additionally she declined to fill Contrave due to cost at this time. She will discuss with GYN next.  Denies fevers chills, nausea vomiting dyspnea, dizziness lightheadedness.  Depression screen PHQ 2/9 11/05/2018  Decreased Interest 2  Down, Depressed, Hopeless 3  PHQ - 2 Score 5  Altered sleeping 3  Tired, decreased energy 2  Change in appetite 2  Feeling bad or failure about yourself  3  Trouble concentrating 3  Moving slowly or fidgety/restless 3  Suicidal thoughts 0  PHQ-9 Score 21    Social History    Tobacco Use  . Smoking status: Former Smoker    Types: Cigarettes    Quit date: 12/02/2018    Years since quitting: 0.6  . Smokeless tobacco: Never Used  . Tobacco comment: 3-4 cigarettes a day  Vaping Use  . Vaping Use: Never used  Substance Use Topics  . Alcohol use: Yes    Alcohol/week: 1.0 standard drink    Types: 1 Glasses of wine per week    Comment: twice a week  . Drug use: Never    Review of Systems Per HPI unless specifically indicated above     Objective:    BP 130/62   Pulse 88   Temp 98.2 F (36.8 C) (Temporal)   Resp 16   Ht 5\' 5"  (1.651 m)   Wt 226 lb 9.6 oz (102.8 kg)   SpO2 96%   BMI 37.71 kg/m   Wt Readings from Last 3 Encounters:  07/17/19 226 lb 9.6 oz (102.8 kg)  07/01/19 227 lb (103 kg)  06/24/19 210 lb (95.3 kg)    Physical Exam Vitals and nursing note reviewed.  Constitutional:      General: She is not in acute distress.    Appearance: She is well-developed. She is not diaphoretic.     Comments: Well-appearing, comfortable, cooperative  HENT:     Head: Normocephalic and atraumatic.  Eyes:     General:        Right eye: No discharge.        Left eye: No discharge.  Conjunctiva/sclera: Conjunctivae normal.  Cardiovascular:     Rate and Rhythm: Normal rate.  Pulmonary:     Effort: Pulmonary effort is normal.  Abdominal:     General: Bowel sounds are normal. There is no distension.     Palpations: Abdomen is soft. There is no mass.     Tenderness: There is no abdominal tenderness.  Musculoskeletal:        General: Normal range of motion.     Comments: Bilateral lower lumbar SI region, not reproducible pain some discomfort on palpation.  No costovertebral angle tenderness on palpation.  Skin:    General: Skin is warm and dry.     Findings: No erythema or rash.  Neurological:     Mental Status: She is alert and oriented to person, place, and time.  Psychiatric:        Behavior: Behavior normal.     Comments: Well groomed,  good eye contact, normal speech and thoughts       Results for orders placed or performed in visit on 07/17/19  POCT Urinalysis Dipstick  Result Value Ref Range   Color, UA dark amber    Clarity, UA cloudy    Glucose, UA Negative Negative   Bilirubin, UA Negative    Ketones, UA Negative    Spec Grav, UA 1.020 1.010 - 1.025   Blood, UA large    pH, UA 5.0 5.0 - 8.0   Protein, UA Positive (A) Negative   Urobilinogen, UA 0.2 0.2 or 1.0 E.U./dL   Nitrite, UA positive    Leukocytes, UA Large (3+) (A) Negative   Appearance cloudy    Odor        Assessment & Plan:   Problem List Items Addressed This Visit    None    Visit Diagnoses    Acute cystitis with hematuria    -  Primary   Relevant Medications   cephALEXin (KEFLEX) 500 MG capsule   Other Relevant Orders   POCT Urinalysis Dipstick (Completed)   Urine Culture   Ambulatory referral to Urology   Nephrolithiasis       Relevant Medications   cyclobenzaprine (FLEXERIL) 10 MG tablet   Other Relevant Orders   Ambulatory referral to Urology   Ureteral colic       Relevant Medications   cyclobenzaprine (FLEXERIL) 10 MG tablet   Other Relevant Orders   Ambulatory referral to Urology      Clinically consistent with UTI possibly recurrent from last episode 6/23, seems only temporary relief on Keflex. Concern with likely underlying Nephrolithiasis, based on her prior history, has seen Urology out of state [place], prior lithotripsy unsuccessful.  No concern for pyelo today (no systemic symptoms, neg fever, back pain, n/v).  Plan: 1. UA / micro - POSITIVE nitrite, large blood 2. Ordered Urine culture 3. Keflex 500mg  TID x 7 days - we discussed options, she is allergic to Cipro. She has some hives or itching with Sulfa. The keflex DID work last time but was 5 day BID dosing, we agree to trial longer course higher dose now. 4. Improve PO hydration 5. For kidney stones / pain - will add Flexeril 10mg  TID PRN, #60 pill, we will  hold off on Flomax due to sulfa allergy cross reaction for now. She has tolerated in past but declines now. She will strain urine. - Referral in to [facility] for consultation on recurrent UTI and Nephrolithiasis, likely will warrant imaging.  RTC if no improvement 1-2 weeks, red flags  given to return sooner    Meds ordered this encounter  Medications  . cephALEXin (KEFLEX) 500 MG capsule    Sig: Take 1 capsule (500 mg total) by mouth 3 (three) times daily. For 7 days    Dispense:  21 capsule    Refill:  0  . cyclobenzaprine (FLEXERIL) 10 MG tablet    Sig: Take 1 tablet (10 mg total) by mouth 3 (three) times daily as needed for muscle spasms.    Dispense:  60 tablet    Refill:  0    Orders Placed This Encounter  Procedures  . Urine Culture  . Ambulatory referral to Urology    Referral Priority:   Routine    Referral Type:   Consultation    Referral Reason:   Specialty Services Required    Requested Specialty:   Urology    Number of Visits Requested:   1  . POCT Urinalysis Dipstick     Follow up plan: Return if symptoms worsen or fail to improve.   Saralyn Pilar, DO [facility] 07/17/2019, 3:16 PM

## 2019-07-19 LAB — URINE CULTURE
MICRO NUMBER:: 10715154
Result:: NO GROWTH
SPECIMEN QUALITY:: ADEQUATE

## 2019-08-04 ENCOUNTER — Encounter: Payer: Self-pay | Admitting: Obstetrics & Gynecology

## 2019-08-04 ENCOUNTER — Other Ambulatory Visit: Payer: Self-pay

## 2019-08-04 ENCOUNTER — Ambulatory Visit (INDEPENDENT_AMBULATORY_CARE_PROVIDER_SITE_OTHER): Payer: BC Managed Care – PPO | Admitting: Obstetrics & Gynecology

## 2019-08-04 VITALS — BP 120/80 | Ht 65.0 in | Wt 228.0 lb

## 2019-08-04 DIAGNOSIS — E669 Obesity, unspecified: Secondary | ICD-10-CM | POA: Diagnosis not present

## 2019-08-04 DIAGNOSIS — Z1231 Encounter for screening mammogram for malignant neoplasm of breast: Secondary | ICD-10-CM | POA: Diagnosis not present

## 2019-08-04 MED ORDER — PHENTERMINE HCL 15 MG PO CAPS: 15.0000 mg | ORAL_CAPSULE | Freq: Two times a day (BID) | ORAL | 0 refills | Status: DC

## 2019-08-04 NOTE — Patient Instructions (Signed)
PAP every three years Mammogram every year    Call [phone] to schedule at [facility] yearly (with PCP)  Thank you for choosing [facility]. As part of our ongoing efforts to improve patient experience, we would appreciate your feedback. Please fill out the short survey that you will receive by mail or MyChart. Your opinion is important to us! - Dr. Julieta Rogalski   

## 2019-08-04 NOTE — Progress Notes (Signed)
HPI:  Patient is a 44 y.o. [id number] presenting for evaluation of abnormal weight gain.  The patient has gained 30 pounds over the past 2 years.  She is s/p NSVD x2 in past (ages 71 and 67) and is s/p BTL.  She also is s/p Ablation 2019, doing well w no periods.   She feels she has gained weight due to problems with her physical activity.  She has these associated symptoms: none.  She has tried self-directed dieting and exercise; also Keo diet no help.  Prior use Phentermine w help but jittery side effects, this was years ago.  PMHx: She  has a past medical history of Anxiety, Kidney stones, Kidney stones, and Sleep apnea. Also,  has a past surgical history that includes Cesarean section; Cholecystectomy; Cervical ablation; Foot surgery (Right); Kidney stone surgery; and Tubal ligation (Bilateral, 04/29/2012)., family history includes COPD in her father and mother; Cancer in her father and maternal grandmother; Dementia in her mother; Diabetes in her paternal grandmother.,  reports that she quit smoking about 8 months ago. Her smoking use included cigarettes. She has never used smokeless tobacco. She reports current alcohol use of about 1.0 standard drink of alcohol per week. She reports that she does not use drugs.  She has a current medication list which includes the following prescription(s): cyclobenzaprine, ibuprofen, valacyclovir, cephalexin, and phentermine. Also, is allergic to ciprofloxacin and sulfa antibiotics.  Review of Systems  Constitutional: Negative for chills, fever and malaise/fatigue.  HENT: Negative for congestion, sinus pain and sore throat.   Eyes: Negative for blurred vision and pain.  Respiratory: Negative for cough and wheezing.   Cardiovascular: Negative for chest pain and leg swelling.  Gastrointestinal: Negative for abdominal pain, constipation, diarrhea, heartburn, nausea and vomiting.  Genitourinary: Negative for dysuria, frequency, hematuria and urgency.    Musculoskeletal: Negative for back pain, joint pain, myalgias and neck pain.  Skin: Negative for itching and rash.  Neurological: Negative for dizziness, tremors and weakness.  Endo/Heme/Allergies: Does not bruise/bleed easily.  Psychiatric/Behavioral: Positive for depression. The patient is nervous/anxious. The patient does not have insomnia.     Objective: BP 120/80   Ht 5\' 5"  (1.651 m)   Wt 228 lb (103.4 kg)   BMI 37.94 kg/m  Physical Exam Constitutional:      General: She is not in acute distress.    Appearance: She is well-developed.  Musculoskeletal:        General: Normal range of motion.  Neurological:     Mental Status: She is alert and oriented to person, place, and time.  Skin:    General: Skin is warm and dry.  Vitals reviewed.     ASSESSMENT:  obesity  Plan: Will assist patient in incorporating positive experiences into her life to promote a positive mental attitude.  Education given regarding appropriate lifestyle changes for weight loss, including regular physical activity, healthy coping strategies, caloric restriction, and healthy eating patterns.  Patient is started on prescription appetite suppressants: Phentermine 15 mg BID to start, may change over to once daily dosing based on tolerability and response..    The risks and benefits as well as side effects of medication, such as Phenteramine or Tenuate, is discussed.  The pros and cons of suppressing appetite and boosting metabolism is counseled.  Risks of tolerance and addiction discussed.  Use of medicine will be short term.  Pt to call with any negative side effects and agrees to keep follow up appointments.  Also plan ANNUAL w PAP  nv (one month) MMG ordered today Labs UTD  Annamarie Major, MD, [facility], [facility] 08/04/2019  8:34 AM

## 2019-08-06 ENCOUNTER — Telehealth: Payer: Self-pay

## 2019-08-06 NOTE — Telephone Encounter (Signed)
No

## 2019-08-06 NOTE — Telephone Encounter (Signed)
Pt calling; was rx'd phentermine capsules which are $100; can it be written for the tablet form that is much cheaper?  [phone]

## 2019-08-07 ENCOUNTER — Other Ambulatory Visit: Payer: Self-pay | Admitting: Obstetrics & Gynecology

## 2019-08-07 DIAGNOSIS — E669 Obesity, unspecified: Secondary | ICD-10-CM

## 2019-08-07 MED ORDER — PHENTERMINE HCL 37.5 MG PO TABS: 18.7500 mg | ORAL_TABLET | Freq: Two times a day (BID) | ORAL | 1 refills | Status: DC

## 2019-08-07 NOTE — Telephone Encounter (Signed)
Let her know Tablets do not come in 15 mg dose so will prescribe higher dose but she needs to cut in half for twice daily dosing

## 2019-08-07 NOTE — Telephone Encounter (Signed)
LMTC

## 2019-08-10 ENCOUNTER — Other Ambulatory Visit: Payer: Self-pay | Admitting: Radiology

## 2019-08-10 ENCOUNTER — Encounter: Payer: Self-pay | Admitting: Urology

## 2019-08-10 ENCOUNTER — Ambulatory Visit (INDEPENDENT_AMBULATORY_CARE_PROVIDER_SITE_OTHER): Payer: BC Managed Care – PPO | Admitting: Urology

## 2019-08-10 ENCOUNTER — Other Ambulatory Visit: Payer: Self-pay

## 2019-08-10 ENCOUNTER — Ambulatory Visit
Admission: RE | Admit: 2019-08-10 | Discharge: 2019-08-10 | Disposition: A | Discharge status: Home / Self Care | Payer: BC Managed Care – PPO | Attending: Urology | Admitting: Urology

## 2019-08-10 ENCOUNTER — Ambulatory Visit
Admission: RE | Admit: 2019-08-10 | Discharge: 2019-08-10 | Disposition: A | Discharge status: Home / Self Care | Payer: BC Managed Care – PPO | Source: Ambulatory Visit | Attending: Urology | Admitting: Urology

## 2019-08-10 VITALS — BP 131/84 | HR 101 | Ht 65.0 in | Wt 223.7 lb

## 2019-08-10 DIAGNOSIS — N23 Unspecified renal colic: Secondary | ICD-10-CM

## 2019-08-10 DIAGNOSIS — R319 Hematuria, unspecified: Secondary | ICD-10-CM | POA: Diagnosis not present

## 2019-08-10 DIAGNOSIS — R300 Dysuria: Secondary | ICD-10-CM

## 2019-08-10 DIAGNOSIS — N200 Calculus of kidney: Secondary | ICD-10-CM | POA: Diagnosis not present

## 2019-08-10 DIAGNOSIS — R102 Pelvic and perineal pain: Secondary | ICD-10-CM

## 2019-08-10 DIAGNOSIS — Z9049 Acquired absence of other specified parts of digestive tract: Secondary | ICD-10-CM | POA: Diagnosis not present

## 2019-08-10 LAB — URINALYSIS, COMPLETE
Bilirubin, UA: NEGATIVE
Glucose, UA: NEGATIVE
Nitrite, UA: NEGATIVE
Specific Gravity, UA: 1.025 (ref 1.005–1.030)
Urobilinogen, Ur: 0.2 mg/dL (ref 0.2–1.0)
pH, UA: 6 (ref 5.0–7.5)

## 2019-08-10 LAB — MICROSCOPIC EXAMINATION: WBC, UA: >30 /hpf — AB (ref 0–5)

## 2019-08-10 MED ORDER — NITROFURANTOIN MACROCRYSTAL 50 MG PO CAPS: ORAL_CAPSULE | ORAL | 1 refills | Status: DC

## 2019-08-10 NOTE — Progress Notes (Addendum)
08/10/2019 11:47 AM   Dewitt Rota Seelbach 05/22/1975 [id number]  Referring provider: Smitty Cords, DO [address] [place],  [place] [address] Chief Complaint  Patient presents with  . Hematuria    HPI: Regina Schneider is a 44 y.o. female seen at the request of Dr. Althea Charon for evaluation of cystitis and history of renal calculi  -History of 2.5 years ago in [place] with Urology evaluation for kidney stones, passed a small stone and had unsuccessful ESWL of a larger calculus -Had been asymptomatic however Urgent Care visit 06/24/19 with 1 week history dysuria UTI symptoms, frequency, urgency.  -UA dip positive large blood and small leukocytes; no microscopy performed; Urine Culture with multiple species present,  -Treated Keflex 500mg  BID for 5 days with symptom improvement. -She last saw her PCP on 07/17/2019. UA/micro positive nitrite, large blood, large leukocytes. Urine culture was negative.  -Treated with Keflex 500mg  TID x 7 days.  -She is sexually active. She notes pain when then 2 days after intercourse in her lower pelvis. -She has pelvic pressure and discomfort. She feels like she has a bladder infection.    PMH: Past Medical History:  Diagnosis Date  . Anxiety   . Kidney stones   . Kidney stones   . Sleep apnea     Surgical History: Past Surgical History:  Procedure Laterality Date  . CERVICAL ABLATION    . CESAREAN SECTION     2  . CHOLECYSTECTOMY    . FOOT SURGERY Right    metatarsal surygery  . KIDNEY STONE SURGERY    . TUBAL LIGATION Bilateral 04/29/2012    Home Medications:  Allergies as of 08/10/2019      Reactions   Ciprofloxacin    Hot flashes and abd pain   Sulfa Antibiotics Hives      Medication List       Accurate as of August 10, 2019 11:47 AM. If you have any questions, ask your nurse or doctor.        cyclobenzaprine 10 MG tablet Commonly known as: FLEXERIL Take 1 tablet (10 mg total) by mouth 3 (three) times daily as  needed for muscle spasms.   ibuprofen 200 MG tablet Commonly known as: ADVIL Take by mouth.   phentermine 37.5 MG tablet Commonly known as: ADIPEX-P Take 0.5 tablets (18.75 mg total) by mouth 2 (two) times daily.   valACYclovir 1000 MG tablet Commonly known as: VALTREX To take BID x 7 days as needed       Allergies:  Allergies  Allergen Reactions  . Ciprofloxacin     Hot flashes and abd pain  . Sulfa Antibiotics Hives    Family History: Family History  Problem Relation Age of Onset  . COPD Mother   . Dementia Mother   . Cancer Father        lung and brain stem  . COPD Father   . Cancer Maternal Grandmother        breast cancer, 50  . Diabetes Paternal Grandmother     Social History:  reports that she quit smoking about 8 months ago. Her smoking use included cigarettes. She has never used smokeless tobacco. She reports current alcohol use of about 1.0 standard drink of alcohol per week. She reports that she does not use drugs.   Physical Exam: BP 131/84   Pulse (!) 101   Ht 5\' 5"  (1.651 m)   Wt 223 lb 11.2 oz (101.5 kg)   BMI 37.23 kg/m  Constitutional:  Alert and oriented, No acute distress. HEENT: [place] AT, moist mucus membranes.  Trachea midline, no masses. Cardiovascular: No clubbing, cyanosis, or edema. Respiratory: Normal respiratory effort, no increased work of breathing. Skin: No rashes, bruises or suspicious lesions. Neurologic: Grossly intact, no focal deficits, moving all 4 extremities. Psychiatric: Normal mood and affect.  Laboratory Data:  Urinalysis Dipstick 2+ blood/1+ protein/3+ leukocytes Microscopy >30 WBC/3-10 RBC/0-10 epis  Pertinent Imaging: KUB performed today was reviewed and there is a 10 mm dense calcification overlying the upper portion of the right renal outline   Assessment & Plan:    1.  Nephrolithiasis  Nonobstructing right renal calculus  History of unsuccessful shockwave lithotripsy on a smaller stone  Schedule  stone protocol CT abdomen/pelvis  2.  Intermittent dysuria  Associated with storage related voiding symptoms  Abnormal dipstick urine with insignificant cultures  Symptoms have been related to intercourse  Will place on postcoital nitrofurantoin suppression  UA today was significant pyuria; urine culture ordered  3.  Pelvic pain  As above   [facility] [address] [place], [place] [address] [phone]  I, Theador Hawthorne, am acting as a scribe for Dr. Lorin Picket C. Asencion Guisinger,  I have reviewed the above documentation for accuracy and completeness, and I agree with the above.   Riki Altes, MD

## 2019-08-10 NOTE — Telephone Encounter (Signed)
Left second message to call me back.

## 2019-08-11 ENCOUNTER — Ambulatory Visit: Payer: BC Managed Care – PPO | Admitting: Urology

## 2019-08-11 NOTE — Telephone Encounter (Signed)
Pt aware.

## 2019-08-11 NOTE — Telephone Encounter (Signed)
Third msg left to call me back.

## 2019-08-12 ENCOUNTER — Telehealth: Payer: Self-pay | Admitting: *Deleted

## 2019-08-12 NOTE — Telephone Encounter (Signed)
Left message on vm per DPR °

## 2019-08-12 NOTE — Telephone Encounter (Signed)
-----   Message from Riki Altes, MD sent at 08/12/2019  7:59 AM EDT ----- Urine culture had no significant growth.  Will contact with CT results once it is performed

## 2019-08-13 LAB — CULTURE, URINE COMPREHENSIVE

## 2019-08-17 ENCOUNTER — Telehealth: Payer: Self-pay | Admitting: Family Medicine

## 2019-08-17 NOTE — Telephone Encounter (Signed)
LMOM notified of normal urine culture.

## 2019-08-17 NOTE — Telephone Encounter (Signed)
-----   Message from Riki Altes, MD sent at 08/16/2019  6:50 PM EDT ----- Urine culture had no significant growth

## 2019-09-16 ENCOUNTER — Other Ambulatory Visit: Payer: BC Managed Care – PPO

## 2019-09-16 ENCOUNTER — Other Ambulatory Visit: Payer: Self-pay

## 2019-09-16 DIAGNOSIS — Z20822 Contact with and (suspected) exposure to covid-19: Secondary | ICD-10-CM | POA: Diagnosis not present

## 2019-09-17 LAB — SARS-COV-2, NAA 2 DAY TAT

## 2019-09-17 LAB — NOVEL CORONAVIRUS, NAA: SARS-CoV-2, NAA: NOT DETECTED

## 2019-10-30 ENCOUNTER — Telehealth: Payer: Self-pay | Admitting: Obstetrics & Gynecology

## 2019-10-30 NOTE — Telephone Encounter (Signed)
Called and left vociemail for patient to call back to be scheduled with [facility] for annual exam

## 2020-01-20 ENCOUNTER — Ambulatory Visit: Payer: Self-pay | Admitting: Obstetrics & Gynecology

## 2020-07-05 ENCOUNTER — Encounter: Payer: Self-pay | Admitting: Internal Medicine

## 2020-07-05 ENCOUNTER — Ambulatory Visit (INDEPENDENT_AMBULATORY_CARE_PROVIDER_SITE_OTHER): Payer: Managed Care, Other (non HMO) | Admitting: Internal Medicine

## 2020-07-05 ENCOUNTER — Other Ambulatory Visit: Payer: Self-pay

## 2020-07-05 VITALS — BP 114/63 | HR 77 | Temp 98.0°F | Resp 16 | Ht 64.0 in | Wt 224.6 lb

## 2020-07-05 DIAGNOSIS — Z6838 Body mass index (BMI) 38.0-38.9, adult: Secondary | ICD-10-CM

## 2020-07-05 DIAGNOSIS — L700 Acne vulgaris: Secondary | ICD-10-CM | POA: Diagnosis not present

## 2020-07-05 DIAGNOSIS — E6609 Other obesity due to excess calories: Secondary | ICD-10-CM

## 2020-07-05 DIAGNOSIS — R109 Unspecified abdominal pain: Secondary | ICD-10-CM

## 2020-07-05 DIAGNOSIS — Z87442 Personal history of urinary calculi: Secondary | ICD-10-CM | POA: Diagnosis not present

## 2020-07-05 DIAGNOSIS — R531 Weakness: Secondary | ICD-10-CM

## 2020-07-05 DIAGNOSIS — R300 Dysuria: Secondary | ICD-10-CM | POA: Diagnosis not present

## 2020-07-05 DIAGNOSIS — R5383 Other fatigue: Secondary | ICD-10-CM

## 2020-07-05 DIAGNOSIS — Z833 Family history of diabetes mellitus: Secondary | ICD-10-CM

## 2020-07-05 LAB — POCT URINALYSIS DIPSTICK OB
Bilirubin, UA: NEGATIVE
Glucose, UA: NEGATIVE
Ketones, UA: NEGATIVE
Leukocytes, UA: NEGATIVE
Nitrite, UA: NEGATIVE
POC,PROTEIN,UA: NEGATIVE
Spec Grav, UA: 1.01 (ref 1.010–1.025)
Urobilinogen, UA: 0.2 E.U./dL
pH, UA: 7 (ref 5.0–8.0)

## 2020-07-05 LAB — POCT GLYCOSYLATED HEMOGLOBIN (HGB A1C): Hemoglobin A1C: 5.8 % — AB (ref 4.0–5.6)

## 2020-07-05 NOTE — Progress Notes (Signed)
Subjective:    Patient ID: Regina Schneider, female    DOB: [birth year], 45 y.o.   MRN: [id number]  HPI  Pt presents to the clinic today with c/o a knot on her neck. She noticed this 2.5 months ago. She does feel like it has gotten bigger in size. She has tried squeezing it without any relief. She has not tried any ointments or creams. She has no family history of skin cancer.  She also reports right flank pain. She reports this started yesterday. She reports some burning with urination but denies urgency and frequency. She does have a history of kidney stones but has not seen a urologist in many years.   She also reports fatigue and weakness that seems worse after eating. She reports a strong family history of DM 2 and would like to be screened for this  today.  Review of Systems  Past Medical History:  Diagnosis Date   Anxiety    Kidney stones    Kidney stones    Sleep apnea     Current Outpatient Medications  Medication Sig Dispense Refill   cyclobenzaprine (FLEXERIL) 10 MG tablet Take 1 tablet (10 mg total) by mouth 3 (three) times daily as needed for muscle spasms. 60 tablet 0   ibuprofen (ADVIL) 200 MG tablet Take by mouth.     nitrofurantoin (MACRODANTIN) 50 MG capsule 1 capsule p.o. after intercourse 30 capsule 1   phentermine (ADIPEX-P) 37.5 MG tablet Take 0.5 tablets (18.75 mg total) by mouth 2 (two) times daily. 30 tablet 1   valACYclovir (VALTREX) 1000 MG tablet To take BID x 7 days as needed     No current facility-administered medications for this visit.    Allergies  Allergen Reactions   Ciprofloxacin     Hot flashes and abd pain   Sulfa Antibiotics Hives    Family History  Problem Relation Age of Onset   COPD Mother    Dementia Mother    Cancer Father        lung and brain stem   COPD Father    Cancer Maternal Grandmother        breast cancer, 50   Diabetes Paternal Grandmother     Social History   Socioeconomic History   Marital status: Unknown     Spouse name: Not on file   Number of children: Not on file   Years of education: Not on file   Highest education level: Not on file  Occupational History   Not on file  Tobacco Use   Smoking status: Former    Pack years: 0.00    Types: Cigarettes    Quit date: 12/02/2018    Years since quitting: 1.5   Smokeless tobacco: Never   Tobacco comments:    3-4 cigarettes a day  Vaping Use   Vaping Use: Never used  Substance and Sexual Activity   Alcohol use: Yes    Alcohol/week: 1.0 standard drink    Types: 1 Glasses of wine per week    Comment: twice a week   Drug use: Never   Sexual activity: Yes    Birth control/protection: None  Other Topics Concern   Not on file  Social History Narrative   Not on file   Social Determinants of Health   Financial Resource Strain: Not on file  Food Insecurity: Not on file  Transportation Needs: Not on file  Physical Activity: Not on file  Stress: Not on file  Social Connections: Not  on file  Intimate Partner Violence: Not on file     Constitutional: Pt reports fatigue. Denies fever, malaise, headache or abrupt weight changes.  HEENT: Denies eye pain, eye redness, ear pain, ringing in the ears, wax buildup, runny nose, nasal congestion, bloody nose, or sore throat. Respiratory: Denies difficulty breathing, shortness of breath, cough or sputum production.   Cardiovascular: Denies chest pain, chest tightness, palpitations or swelling in the hands or feet.  Gastrointestinal: Pt reports right flank pain. Denies abdominal pain, bloating, constipation, diarrhea or blood in the stool.  GU: Pt reports burning with urination. Denies urgency, frequency, pain with urination, blood in urine, odor or discharge. Musculoskeletal: Pt reports weakness. Denies decrease in range of motion, difficulty with gait, muscle pain or joint pain and swelling.  Skin: Pt reports skin lesion of neck. Denies redness, rashes, or ulcercations.    No other specific  complaints in a complete review of systems (except as listed in HPI above).     Objective:   Physical Exam  BP 114/63   Pulse 77   Temp 98 F (36.7 C) (Temporal)   Resp 16   Ht 5\' 4"  (1.626 m)   Wt 224 lb 9.6 oz (101.9 kg)   BMI 38.55 kg/m   Wt Readings from Last 3 Encounters:  08/10/19 223 lb 11.2 oz (101.5 kg)  08/04/19 228 lb (103.4 kg)  07/17/19 226 lb 9.6 oz (102.8 kg)    General: Appears her stated age, obese, in NAD. Skin: 2 mm comedone noted of right anterior neck. HEENT: Head: normal shape and size; Eyes: EOMs intact;  Cardiovascular: Normal rate and rhythm. S1,S2 noted.  No murmur, rubs or gallops noted.  Pulmonary/Chest: Normal effort and positive vesicular breath sounds. No respiratory distress. No wheezes, rales or ronchi noted.  Abdomen: No CVA tenderness noted. Neurological: Alert and oriented.      Assessment & Plan:   Comedone of Neck:  The comedone was removed manually by this provider No indication for referral to dermatology at this time  Burning with Urination, Right Flank Pain, History of Kidney Stones:  Urinalysis: trace blood Encouraged adequate water intake  Fatigue/Weakness after Eating, Family History of DM 2:  POCT A1C today 5.8% Discussed low carb/keto diet for weight loss  Make an appt for your annual exam  07/19/19, NP This visit occurred during the SARS-CoV-2 public health emergency.  Safety protocols were in place, including screening questions prior to the visit, additional usage of staff PPE, and extensive cleaning of exam room while observing appropriate contact time as indicated for disinfecting solutions.

## 2020-07-05 NOTE — Patient Instructions (Signed)
Flank Pain, Adult Flank pain is pain in your side. The flank is the area of your side between your upper belly (abdomen) and your back. The pain may occur over a short time (acute), or it may be long-term or come back often (chronic). It may be mild or very bad. Pain in this area can be caused by manydifferent things. Follow these instructions at home:  Drink enough fluid to keep your pee (urine) clear or pale yellow. Rest as told by your doctor. Take over-the-counter and prescription medicines only as told by your doctor. Keep a journal to keep track of: What has caused your flank pain. What has made it feel better. Keep all follow-up visits as told by your doctor. This is important. Contact a doctor if: Medicine does not help your pain. You have new symptoms. Your pain gets worse. You have a fever. Your symptoms last longer than 2-3 days. You have trouble peeing. You are peeing more often than normal. Get help right away if: You have trouble breathing. You are short of breath. Your belly hurts, or it is swollen or red. You feel sick to your stomach (nauseous). You throw up (vomit). You feel like you will pass out, or you do pass out (faint). You have blood in your pee. Summary Flank pain is pain in your side. The flank is the area of your side between your upper belly (abdomen) and your back. Flank pain may occur over a short time (acute), or it may be long-term or come back often (chronic). It may be mild or very bad. Pain in this area can be caused by many different things. Contact your doctor if your symptoms get worse or they last longer than 2-3 days. This information is not intended to replace advice given to you by your health care provider. Make sure you discuss any questions you have with your healthcare provider. Document Revised: 09/08/2019 Document Reviewed: 09/11/2019 Elsevier Patient Education  2022 Elsevier Inc.  

## 2020-08-02 ENCOUNTER — Ambulatory Visit: Payer: Managed Care, Other (non HMO) | Admitting: Internal Medicine

## 2020-08-02 NOTE — Progress Notes (Deleted)
Subjective:    Patient ID: Regina Schneider, female    DOB: 1975-04-08, 45 y.o.   MRN: [id number]  HPI  Pt presents to the clinic today for follow up of chronic conditions.  OSA: She averages hours of sleep per night with the use of her CPAP. There is no sleep study on file.  GAD: She is not currently taking any medications for this. She is not seeing a therapist. She denies depression, SI/HI.  HSV: She denies recent outbreak. She takes Valacyclovir as needed.  Prediabetes: Her last A1C was 5.8%, 07/2020. She is not taking any oral diabetic medication at this time. She does not check her sugars.  Review of Systems     Past Medical History:  Diagnosis Date   Anxiety    Kidney stones    Kidney stones    Sleep apnea     Current Outpatient Medications  Medication Sig Dispense Refill   ibuprofen (ADVIL) 200 MG tablet Take by mouth.     valACYclovir (VALTREX) 1000 MG tablet To take BID x 7 days as needed     No current facility-administered medications for this visit.    Allergies  Allergen Reactions   Ciprofloxacin     Hot flashes and abd pain   Sulfa Antibiotics Hives    Family History  Problem Relation Age of Onset   COPD Mother    Dementia Mother    Cancer Father        lung and brain stem   COPD Father    Cancer Maternal Grandmother        breast cancer, 50   Diabetes Paternal Grandmother     Social History   Socioeconomic History   Marital status: Unknown    Spouse name: Not on file   Number of children: Not on file   Years of education: Not on file   Highest education level: Not on file  Occupational History   Not on file  Tobacco Use   Smoking status: Former    Types: Cigarettes    Quit date: 12/02/2018    Years since quitting: 1.6   Smokeless tobacco: Never   Tobacco comments:    3-4 cigarettes a day  Vaping Use   Vaping Use: Never used  Substance and Sexual Activity   Alcohol use: Yes    Alcohol/week: 1.0 standard drink    Types: 1  Glasses of wine per week    Comment: twice a week   Drug use: Never   Sexual activity: Yes    Birth control/protection: None  Other Topics Concern   Not on file  Social History Narrative   Not on file   Social Determinants of Health   Financial Resource Strain: Not on file  Food Insecurity: Not on file  Transportation Needs: Not on file  Physical Activity: Not on file  Stress: Not on file  Social Connections: Not on file  Intimate Partner Violence: Not on file     Constitutional: Denies fever, malaise, fatigue, headache or abrupt weight changes.  HEENT: Denies eye pain, eye redness, ear pain, ringing in the ears, wax buildup, runny nose, nasal congestion, bloody nose, or sore throat. Respiratory: Denies difficulty breathing, shortness of breath, cough or sputum production.   Cardiovascular: Denies chest pain, chest tightness, palpitations or swelling in the hands or feet.  Gastrointestinal: Denies abdominal pain, bloating, constipation, diarrhea or blood in the stool.  GU: Denies urgency, frequency, pain with urination, burning sensation, blood in urine, odor  or discharge. Musculoskeletal: Denies decrease in range of motion, difficulty with gait, muscle pain or joint pain and swelling.  Skin: Denies redness, rashes, lesions or ulcercations.  Neurological: Denies dizziness, difficulty with memory, difficulty with speech or problems with balance and coordination.  Psych: Pt has a history of anxiety. Denies depression, SI/HI.  No other specific complaints in a complete review of systems (except as listed in HPI above).  Objective:   Physical Exam  There were no vitals taken for this visit. Wt Readings from Last 3 Encounters:  07/05/20 224 lb 9.6 oz (101.9 kg)  08/10/19 223 lb 11.2 oz (101.5 kg)  08/04/19 228 lb (103.4 kg)    General: Appears their stated age, well developed, well nourished in NAD. Skin: Warm, dry and intact. No rashes, lesions or ulcerations noted. HEENT:  Head: normal shape and size; Eyes: sclera white, no icterus, conjunctiva pink, PERRLA and EOMs intact; Ears: Tm's gray and intact, normal light reflex; Nose: mucosa pink and moist, septum midline; Throat/Mouth: Teeth present, mucosa pink and moist, no exudate, lesions or ulcerations noted.  Neck:  Neck supple, trachea midline. No masses, lumps or thyromegaly present.  Cardiovascular: Normal rate and rhythm. S1,S2 noted.  No murmur, rubs or gallops noted. No JVD or BLE edema. No carotid bruits noted. Pulmonary/Chest: Normal effort and positive vesicular breath sounds. No respiratory distress. No wheezes, rales or ronchi noted.  Abdomen: Soft and nontender. Normal bowel sounds. No distention or masses noted. Liver, spleen and kidneys non palpable. Musculoskeletal: Normal range of motion. No signs of joint swelling. No difficulty with gait.  Neurological: Alert and oriented. Cranial nerves II-XII grossly intact. Coordination normal.  Psychiatric: Mood and affect normal. Behavior is normal. Judgment and thought content normal.    Hgb A1C Lab Results  Component Value Date   HGBA1C 5.8 (A) 07/05/2020            Assessment & Plan:    Regina Reaper, NP This visit occurred during the SARS-CoV-2 public health emergency.  Safety protocols were in place, including screening questions prior to the visit, additional usage of staff PPE, and extensive cleaning of exam room while observing appropriate contact time as indicated for disinfecting solutions.

## 2020-08-04 ENCOUNTER — Encounter: Payer: Self-pay | Admitting: Internal Medicine

## 2020-08-04 MED ORDER — BUSPIRONE HCL 5 MG PO TABS
5.0000 mg | ORAL_TABLET | Freq: Two times a day (BID) | ORAL | 0 refills | Status: DC | PRN
Start: 2020-08-04 — End: 2021-03-20

## 2020-08-15 ENCOUNTER — Ambulatory Visit: Payer: Managed Care, Other (non HMO) | Admitting: Internal Medicine

## 2020-08-15 NOTE — Progress Notes (Deleted)
Subjective:    Patient ID: Regina Schneider, female    DOB: February 18, 1975, 45 y.o.   MRN: [id number]  HPI  Pt presents to the clinic today with c/o anxiety. She is taking Buspirone as needed. She is not currently seeing a therapist. She denies SI/HI.   Review of Systems     Past Medical History:  Diagnosis Date   Anxiety    Kidney stones    Kidney stones    Sleep apnea     Current Outpatient Medications  Medication Sig Dispense Refill   busPIRone (BUSPAR) 5 MG tablet Take 1 tablet (5 mg total) by mouth 2 (two) times daily as needed. 60 tablet 0   ibuprofen (ADVIL) 200 MG tablet Take by mouth.     valACYclovir (VALTREX) 1000 MG tablet To take BID x 7 days as needed     No current facility-administered medications for this visit.    Allergies  Allergen Reactions   Ciprofloxacin     Hot flashes and abd pain   Sulfa Antibiotics Hives    Family History  Problem Relation Age of Onset   COPD Mother    Dementia Mother    Cancer Father        lung and brain stem   COPD Father    Cancer Maternal Grandmother        breast cancer, 50   Diabetes Paternal Grandmother     Social History   Socioeconomic History   Marital status: Unknown    Spouse name: Not on file   Number of children: Not on file   Years of education: Not on file   Highest education level: Not on file  Occupational History   Not on file  Tobacco Use   Smoking status: Former    Types: Cigarettes    Quit date: 12/02/2018    Years since quitting: 1.7   Smokeless tobacco: Never   Tobacco comments:    3-4 cigarettes a day  Vaping Use   Vaping Use: Never used  Substance and Sexual Activity   Alcohol use: Yes    Alcohol/week: 1.0 standard drink    Types: 1 Glasses of wine per week    Comment: twice a week   Drug use: Never   Sexual activity: Yes    Birth control/protection: None  Other Topics Concern   Not on file  Social History Narrative   Not on file   Social Determinants of Health    Financial Resource Strain: Not on file  Food Insecurity: Not on file  Transportation Needs: Not on file  Physical Activity: Not on file  Stress: Not on file  Social Connections: Not on file  Intimate Partner Violence: Not on file     Constitutional: Denies fever, malaise, fatigue, headache or abrupt weight changes.  HEENT: Denies eye pain, eye redness, ear pain, ringing in the ears, wax buildup, runny nose, nasal congestion, bloody nose, or sore throat. Respiratory: Denies difficulty breathing, shortness of breath, cough or sputum production.   Cardiovascular: Denies chest pain, chest tightness, palpitations or swelling in the hands or feet.  Gastrointestinal: Denies abdominal pain, bloating, constipation, diarrhea or blood in the stool.  GU: Denies urgency, frequency, pain with urination, burning sensation, blood in urine, odor or discharge. Musculoskeletal: Denies decrease in range of motion, difficulty with gait, muscle pain or joint pain and swelling.  Skin: Denies redness, rashes, lesions or ulcercations.  Neurological: Denies dizziness, difficulty with memory, difficulty with speech or problems with balance  and coordination.  Psych: Pt reports anxiety. Denies depression, SI/HI.  No other specific complaints in a complete review of systems (except as listed in HPI above).  Objective:   Physical Exam   There were no vitals taken for this visit. Wt Readings from Last 3 Encounters:  07/05/20 224 lb 9.6 oz (101.9 kg)  08/10/19 223 lb 11.2 oz (101.5 kg)  08/04/19 228 lb (103.4 kg)    General: Appears their stated age, well developed, well nourished in NAD. Skin: Warm, dry and intact. No rashes, lesions or ulcerations noted. HEENT: Head: normal shape and size; Eyes: sclera white, no icterus, conjunctiva pink, PERRLA and EOMs intact; Ears: Tm's gray and intact, normal light reflex; Nose: mucosa pink and moist, septum midline; Throat/Mouth: Teeth present, mucosa pink and moist,  no exudate, lesions or ulcerations noted.  Neck:  Neck supple, trachea midline. No masses, lumps or thyromegaly present.  Cardiovascular: Normal rate and rhythm. S1,S2 noted.  No murmur, rubs or gallops noted. No JVD or BLE edema. No carotid bruits noted. Pulmonary/Chest: Normal effort and positive vesicular breath sounds. No respiratory distress. No wheezes, rales or ronchi noted.  Abdomen: Soft and nontender. Normal bowel sounds. No distention or masses noted. Liver, spleen and kidneys non palpable. Musculoskeletal: Normal range of motion. No signs of joint swelling. No difficulty with gait.  Neurological: Alert and oriented. Cranial nerves II-XII grossly intact. Coordination normal.  Psychiatric: Mood and affect normal. Behavior is normal. Judgment and thought content normal.    Hgb A1C Lab Results  Component Value Date   HGBA1C 5.8 (A) 07/05/2020           Assessment & Plan:   Nicki Reaper, NP This visit occurred during the SARS-CoV-2 public health emergency.  Safety protocols were in place, including screening questions prior to the visit, additional usage of staff PPE, and extensive cleaning of exam room while observing appropriate contact time as indicated for disinfecting solutions.

## 2020-08-22 ENCOUNTER — Other Ambulatory Visit: Payer: Self-pay

## 2020-08-22 ENCOUNTER — Ambulatory Visit (INDEPENDENT_AMBULATORY_CARE_PROVIDER_SITE_OTHER): Payer: Managed Care, Other (non HMO) | Admitting: Internal Medicine

## 2020-08-22 ENCOUNTER — Encounter: Payer: Self-pay | Admitting: Internal Medicine

## 2020-08-22 DIAGNOSIS — R7303 Prediabetes: Secondary | ICD-10-CM | POA: Insufficient documentation

## 2020-08-22 DIAGNOSIS — F411 Generalized anxiety disorder: Secondary | ICD-10-CM | POA: Diagnosis not present

## 2020-08-22 MED ORDER — ESCITALOPRAM OXALATE 10 MG PO TABS
10.0000 mg | ORAL_TABLET | Freq: Every day | ORAL | 0 refills | Status: DC
Start: 2020-08-22 — End: 2020-10-24

## 2020-08-22 NOTE — Patient Instructions (Signed)

## 2020-08-22 NOTE — Assessment & Plan Note (Signed)
Will restart Escitalopram Continue buspirone as needed Encourage stress reducing techniques Support offered

## 2020-08-22 NOTE — Progress Notes (Signed)
Subjective:    Patient ID: Regina Schneider, female    DOB: November 22, 1975, 45 y.o.   MRN: [id number]  HPI  Patient presents the clinic today with complaint of anxiety.  She reports this is mostly related to work stress but she also has a lot of things going on in her personal life as well.  She is having some difficulty sleeping through the night.  She is taking Buspirone as needed with good relief of symptoms.  She was on Escitalopram in the past and would like to get started on this again.  She is currently seeing a Industrial/product designer.  She denies depression, SI/HI.  Review of Systems     Past Medical History:  Diagnosis Date   Anxiety    Kidney stones    Kidney stones    Sleep apnea     Current Outpatient Medications  Medication Sig Dispense Refill   busPIRone (BUSPAR) 5 MG tablet Take 1 tablet (5 mg total) by mouth 2 (two) times daily as needed. 60 tablet 0   ibuprofen (ADVIL) 200 MG tablet Take by mouth.     valACYclovir (VALTREX) 1000 MG tablet To take BID x 7 days as needed     No current facility-administered medications for this visit.    Allergies  Allergen Reactions   Ciprofloxacin     Hot flashes and abd pain   Sulfa Antibiotics Hives    Family History  Problem Relation Age of Onset   COPD Mother    Dementia Mother    Cancer Father        lung and brain stem   COPD Father    Cancer Maternal Grandmother        breast cancer, 50   Diabetes Paternal Grandmother     Social History   Socioeconomic History   Marital status: Unknown    Spouse name: Not on file   Number of children: Not on file   Years of education: Not on file   Highest education level: Not on file  Occupational History   Not on file  Tobacco Use   Smoking status: Former    Types: Cigarettes    Quit date: 12/02/2018    Years since quitting: 1.7   Smokeless tobacco: Never   Tobacco comments:    3-4 cigarettes a day  Vaping Use   Vaping Use: Never used  Substance and Sexual Activity    Alcohol use: Yes    Alcohol/week: 1.0 standard drink    Types: 1 Glasses of wine per week    Comment: twice a week   Drug use: Never   Sexual activity: Yes    Birth control/protection: None  Other Topics Concern   Not on file  Social History Narrative   Not on file   Social Determinants of Health   Financial Resource Strain: Not on file  Food Insecurity: Not on file  Transportation Needs: Not on file  Physical Activity: Not on file  Stress: Not on file  Social Connections: Not on file  Intimate Partner Violence: Not on file     Constitutional: Denies fever, malaise, fatigue, headache or abrupt weight changes.  Respiratory: Denies difficulty breathing, shortness of breath, cough or sputum production.   Cardiovascular: Denies chest pain, chest tightness, palpitations or swelling in the hands or feet.  Neurological: Patient reports insomnia.  Denies dizziness, difficulty with memory, difficulty with speech or problems with balance and coordination.  Psych: Patient reports anxiety.  Denies depression, SI/HI.  No other specific complaints in a complete review of systems (except as listed in HPI above).  Objective:   Physical Exam  BP (!) 123/52 (BP Location: Right Arm, Patient Position: Sitting, Cuff Size: Large)   Pulse 79   Temp (!) 97.5 F (36.4 C) (Temporal)   Ht 5\' 4"  (1.626 m)   Wt 226 lb (102.5 kg)   SpO2 100%   BMI 38.79 kg/m   Wt Readings from Last 3 Encounters:  07/05/20 224 lb 9.6 oz (101.9 kg)  08/10/19 223 lb 11.2 oz (101.5 kg)  08/04/19 228 lb (103.4 kg)    General: Appears her stated age, obese, in NAD. Cardiovascular: Normal rate. Pulmonary/Chest: Normal effort. Neurological: Alert and oriented.  Psychiatric: Mood and affect normal. Behavior is normal. Judgment and thought content normal.    Lab Results  Component Value Date   HGBA1C 5.8 (A) 07/05/2020           Assessment & Plan:   09/05/2020, NP This visit occurred during the  SARS-CoV-2 public health emergency.  Safety protocols were in place, including screening questions prior to the visit, additional usage of staff PPE, and extensive cleaning of exam room while observing appropriate contact time as indicated for disinfecting solutions.

## 2020-08-25 ENCOUNTER — Telehealth: Payer: 59

## 2020-08-25 NOTE — Telephone Encounter (Signed)
-----   Message from Nadara Mustard, MD sent at 08/25/2020  7:26 AM EDT ----- Regarding: Sch Annual and encourage MMG to be done prior

## 2020-08-25 NOTE — Telephone Encounter (Signed)
Called and left voicemail for patient to call back to be scheduled. 

## 2020-10-23 ENCOUNTER — Other Ambulatory Visit: Payer: Self-pay | Admitting: Internal Medicine

## 2020-10-24 ENCOUNTER — Other Ambulatory Visit: Payer: Self-pay | Admitting: Internal Medicine

## 2020-10-24 NOTE — Telephone Encounter (Signed)
Requested Prescriptions  Pending Prescriptions Disp Refills  . escitalopram (LEXAPRO) 10 MG tablet [Pharmacy Med Name: ESCITALOPRAM 10MG  TABLETS] 90 tablet 0    Sig: TAKE 1 TABLET(10 MG) BY MOUTH DAILY     Psychiatry:  Antidepressants - SSRI Passed - 10/23/2020 12:28 PM      Passed - Valid encounter within last 6 months    Recent Outpatient Visits          2 months ago GAD (generalized anxiety disorder)   [facility] [place], [place], NP   3 months ago History of kidney stones   [facility] [place], Mullins, NP   1 year ago Acute cystitis with hematuria   [facility], DO   1 year ago Encounter to establish care with new doctor   [facility], Jodelle Gross

## 2020-11-11 ENCOUNTER — Ambulatory Visit: Payer: Managed Care, Other (non HMO) | Admitting: Internal Medicine

## 2020-11-11 NOTE — Progress Notes (Deleted)
Subjective:    Patient ID: Regina Schneider, female    DOB: October 21, 1975, 45 y.o.   MRN: [id number]  HPI  Patient presents to clinic today for her annual exam.  Flu: 10/2018 Tetanus: 07/2015 COVID: Alphonsa Overall x1 Pap smear: 05/2019 Mammogram: 04/2017 Colon screening: Never Vision screening: Dentist:  Diet: Exercise:  Review of Systems     Past Medical History:  Diagnosis Date   Anxiety    Kidney stones    Kidney stones    Sleep apnea     Current Outpatient Medications  Medication Sig Dispense Refill   busPIRone (BUSPAR) 5 MG tablet Take 1 tablet (5 mg total) by mouth 2 (two) times daily as needed. (Patient not taking: Reported on 08/22/2020) 60 tablet 0   escitalopram (LEXAPRO) 10 MG tablet TAKE 1 TABLET(10 MG) BY MOUTH DAILY 90 tablet 0   ibuprofen (ADVIL) 200 MG tablet Take by mouth.     valACYclovir (VALTREX) 1000 MG tablet To take BID x 7 days as needed     No current facility-administered medications for this visit.    Allergies  Allergen Reactions   Ciprofloxacin     Hot flashes and abd pain   Sulfa Antibiotics Hives    Family History  Problem Relation Age of Onset   COPD Mother    Dementia Mother    Cancer Father        lung and brain stem   COPD Father    Cancer Maternal Grandmother        breast cancer, 46   Diabetes Paternal Grandmother     Social History   Socioeconomic History   Marital status: Unknown    Spouse name: Not on file   Number of children: Not on file   Years of education: Not on file   Highest education level: Not on file  Occupational History   Not on file  Tobacco Use   Smoking status: Former    Types: Cigarettes    Quit date: 12/02/2018    Years since quitting: 1.9   Smokeless tobacco: Never   Tobacco comments:    3-4 cigarettes a day  Vaping Use   Vaping Use: Never used  Substance and Sexual Activity   Alcohol use: Yes    Alcohol/week: 1.0 standard drink    Types: 1 Glasses of wine per week    Comment: twice a  week   Drug use: Never   Sexual activity: Yes    Birth control/protection: None  Other Topics Concern   Not on file  Social History Narrative   Not on file   Social Determinants of Health   Financial Resource Strain: Not on file  Food Insecurity: Not on file  Transportation Needs: Not on file  Physical Activity: Not on file  Stress: Not on file  Social Connections: Not on file  Intimate Partner Violence: Not on file     Constitutional: Denies fever, malaise, fatigue, headache or abrupt weight changes.  HEENT: Denies eye pain, eye redness, ear pain, ringing in the ears, wax buildup, runny nose, nasal congestion, bloody nose, or sore throat. Respiratory: Denies difficulty breathing, shortness of breath, cough or sputum production.   Cardiovascular: Denies chest pain, chest tightness, palpitations or swelling in the hands or feet.  Gastrointestinal: Denies abdominal pain, bloating, constipation, diarrhea or blood in the stool.  GU: Denies urgency, frequency, pain with urination, burning sensation, blood in urine, odor or discharge. Musculoskeletal: Denies decrease in range of motion, difficulty with gait, muscle  pain or joint pain and swelling.  Skin: Denies redness, rashes, lesions or ulcercations.  Neurological: Denies dizziness, difficulty with memory, difficulty with speech or problems with balance and coordination.  Psych: Patient has a history of anxiety.  Denies depression, SI/HI.  No other specific complaints in a complete review of systems (except as listed in HPI above).  Objective:   Physical Exam  There were no vitals taken for this visit. Wt Readings from Last 3 Encounters:  08/22/20 226 lb (102.5 kg)  07/05/20 224 lb 9.6 oz (101.9 kg)  08/10/19 223 lb 11.2 oz (101.5 kg)    General: Appears their stated age, well developed, well nourished in NAD. Skin: Warm, dry and intact. No rashes, lesions or ulcerations noted. HEENT: Head: normal shape and size; Eyes:  sclera white, no icterus, conjunctiva pink, PERRLA and EOMs intact; Ears: Tm's gray and intact, normal light reflex; Nose: mucosa pink and moist, septum midline; Throat/Mouth: Teeth present, mucosa pink and moist, no exudate, lesions or ulcerations noted.  Neck:  Neck supple, trachea midline. No masses, lumps or thyromegaly present.  Cardiovascular: Normal rate and rhythm. S1,S2 noted.  No murmur, rubs or gallops noted. No JVD or BLE edema. No carotid bruits noted. Pulmonary/Chest: Normal effort and positive vesicular breath sounds. No respiratory distress. No wheezes, rales or ronchi noted.  Abdomen: Soft and nontender. Normal bowel sounds. No distention or masses noted. Liver, spleen and kidneys non palpable. Musculoskeletal: Normal range of motion. No signs of joint swelling. No difficulty with gait.  Neurological: Alert and oriented. Cranial nerves II-XII grossly intact. Coordination normal.  Psychiatric: Mood and affect normal. Behavior is normal. Judgment and thought content normal.     Hgb A1C Lab Results  Component Value Date   HGBA1C 5.8 (A) 07/05/2020            Assessment & Plan:   Preventative Health Maintenance:  Flu shot today Tetanus UTD Encouraged her to get her COVID booster Pap smear UTD Mammogram ordered, she will call to schedule Referral to GI for screening colonoscopy Encouraged her to consume a balanced diet and exercise regimen Advised her to see an eye doctor and dentist annually We will check CBC, c-Met, TSH, lipid, A1c, HIV and hep C today  RTC in 1 year, sooner if needed Webb Silversmith, NP This visit occurred during the SARS-CoV-2 public health emergency.  Safety protocols were in place, including screening questions prior to the visit, additional usage of staff PPE, and extensive cleaning of exam room while observing appropriate contact time as indicated for disinfecting solutions.

## 2020-11-20 IMAGING — CR DG SHOULDER 2+V*L*
3 series · 3 of 3 positions shown · non-contrast
Comparison: No prior.

CLINICAL DATA: Fall.

EXAM:
LEFT SHOULDER - 2+ VIEW

[shoulder grashey]
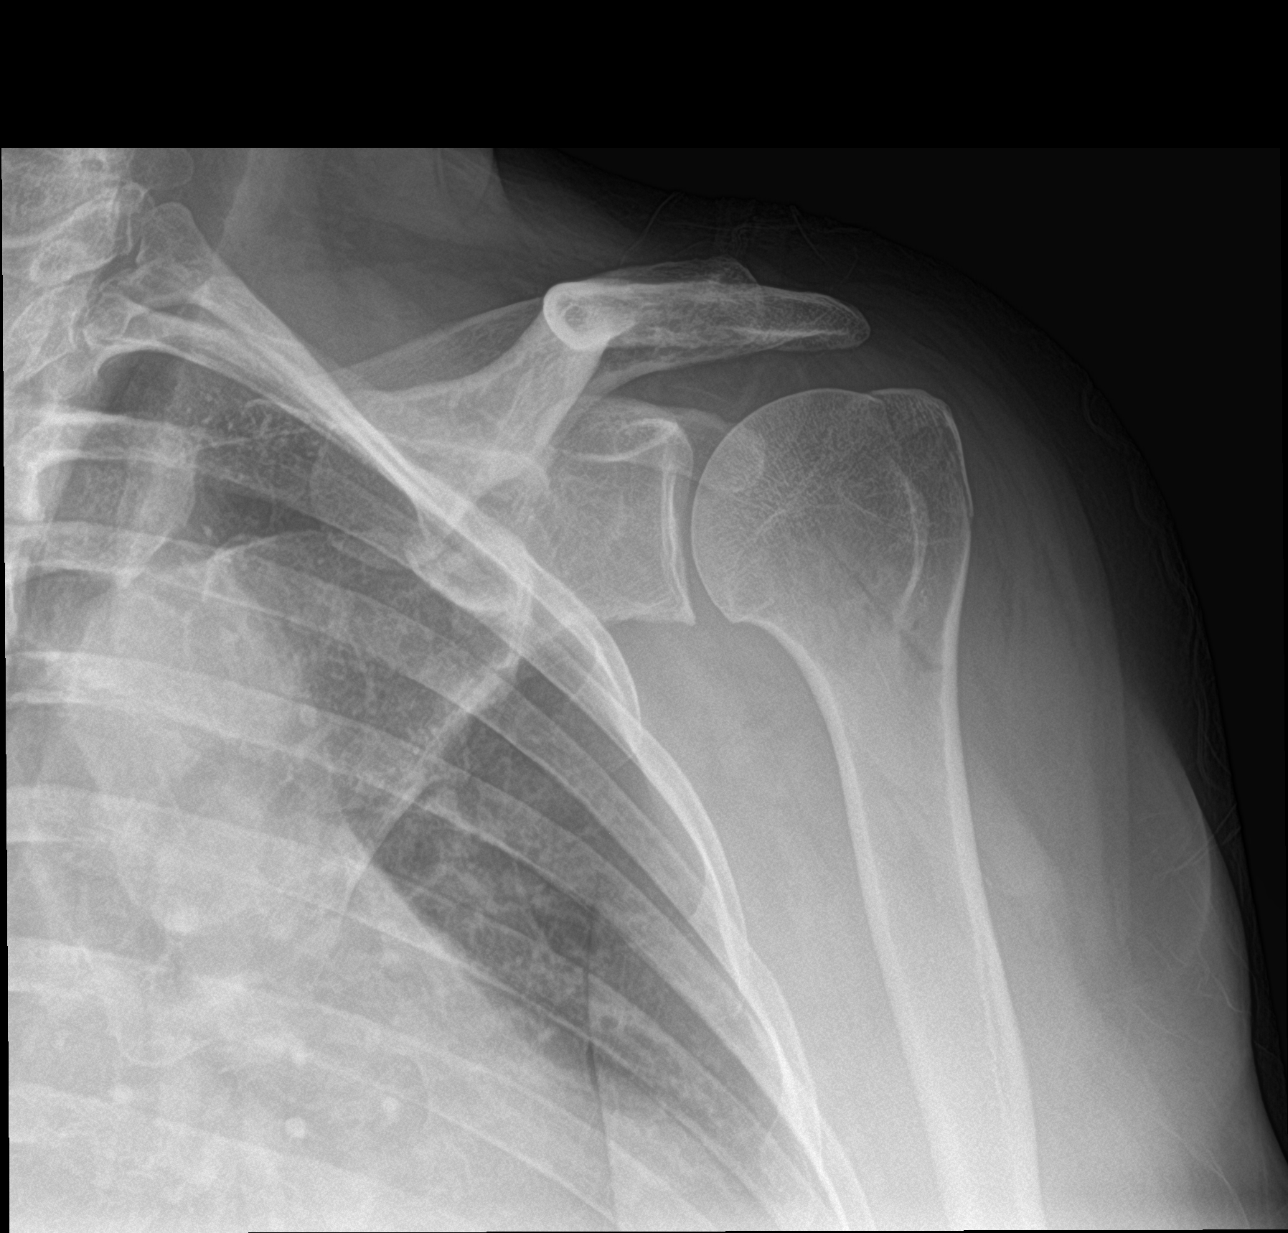

[shoulder y view]
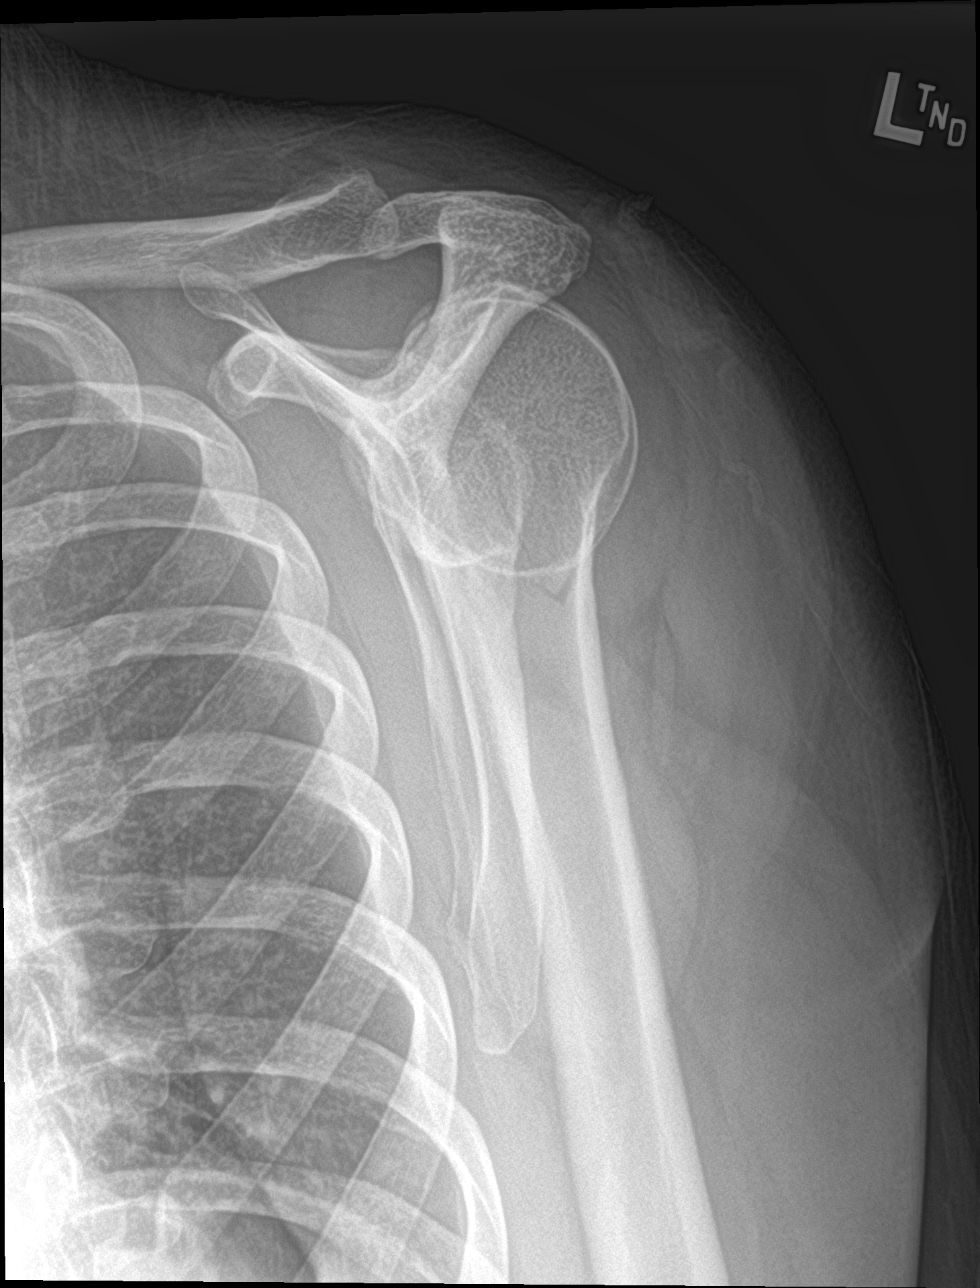

[shoulder axillary]
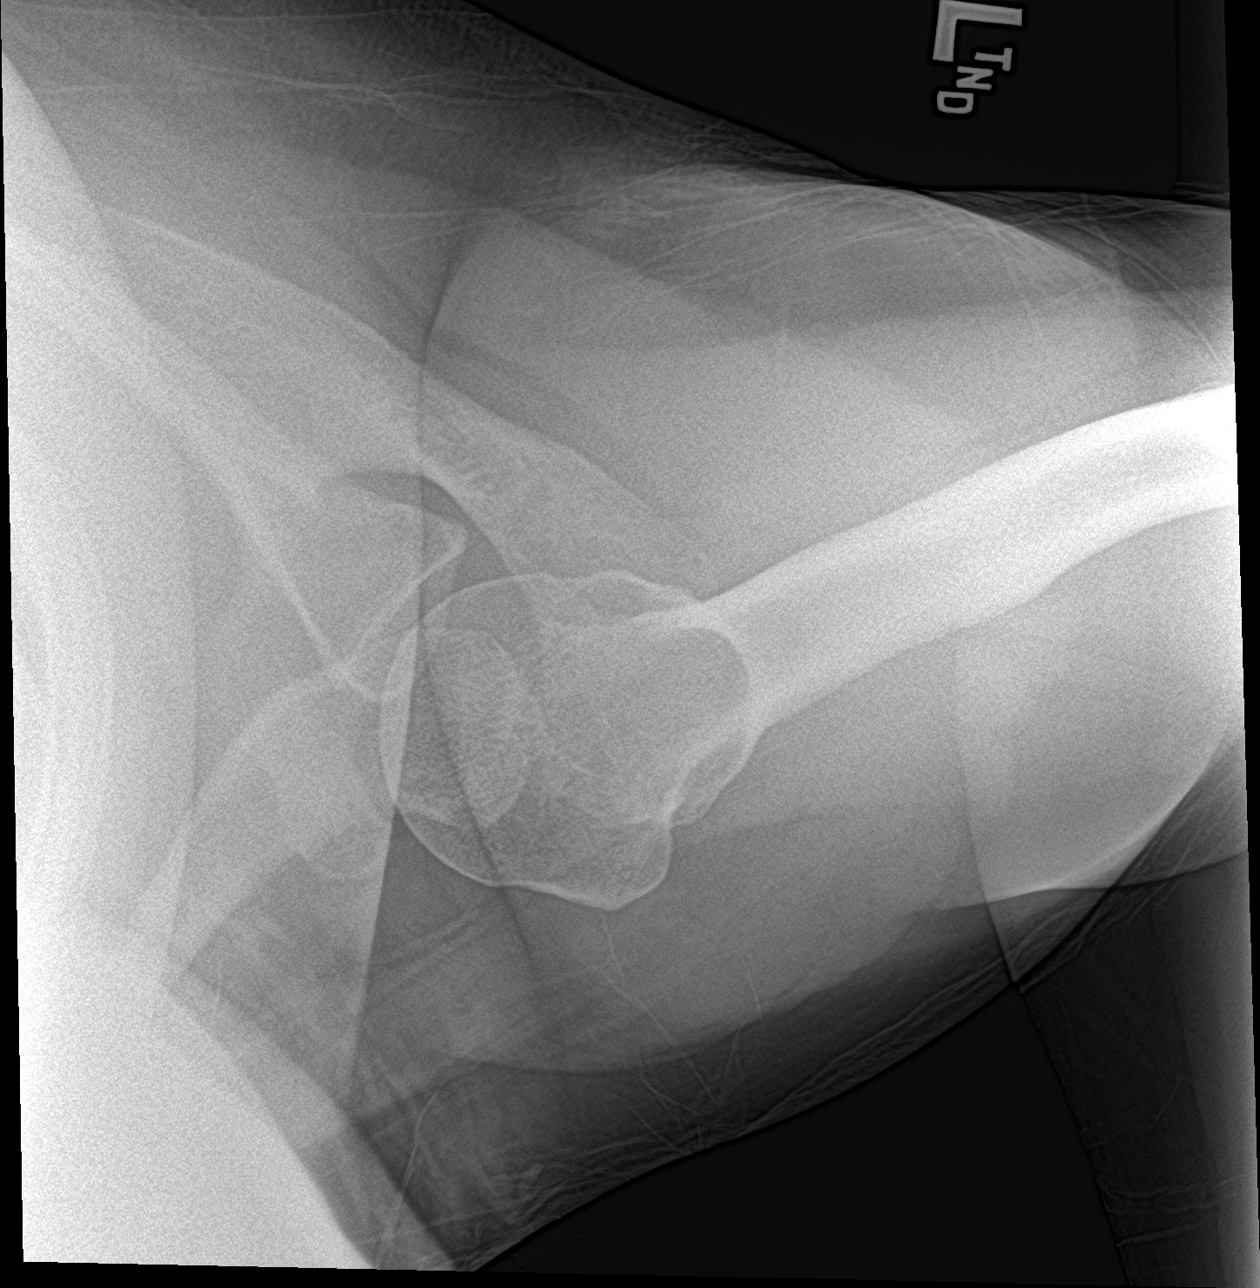

[3 of 3 positions shown; findings below may reference images not displayed]

FINDINGS: Lucency noted along the proximal left humeral metaphysis most
consistent with a fracture, nondisplaced. Probable nondisplaced
associated fracture of the greater tuberosity. Degenerative changes
noted about the acromioclavicular and glenohumeral. No evidence of
dislocation or separation about the left shoulder.
IMPRESSION: Findings consistent with nondisplaced fracture of the proximal left
humeral metaphysis and the greater tuberosity of the left humerus.
Confirmation with MRI can be obtained as needed.

## 2020-12-22 ENCOUNTER — Other Ambulatory Visit: Payer: Self-pay | Admitting: Obstetrics & Gynecology

## 2020-12-22 DIAGNOSIS — Z1231 Encounter for screening mammogram for malignant neoplasm of breast: Secondary | ICD-10-CM

## 2021-01-11 IMAGING — CR DG ABDOMEN 1V
2 series · 2 of 2 positions shown · non-contrast
Comparison: None.

CLINICAL DATA: Renal colic

EXAM:
ABDOMEN - 1 VIEW

[abdomen kub (1 of 2)]
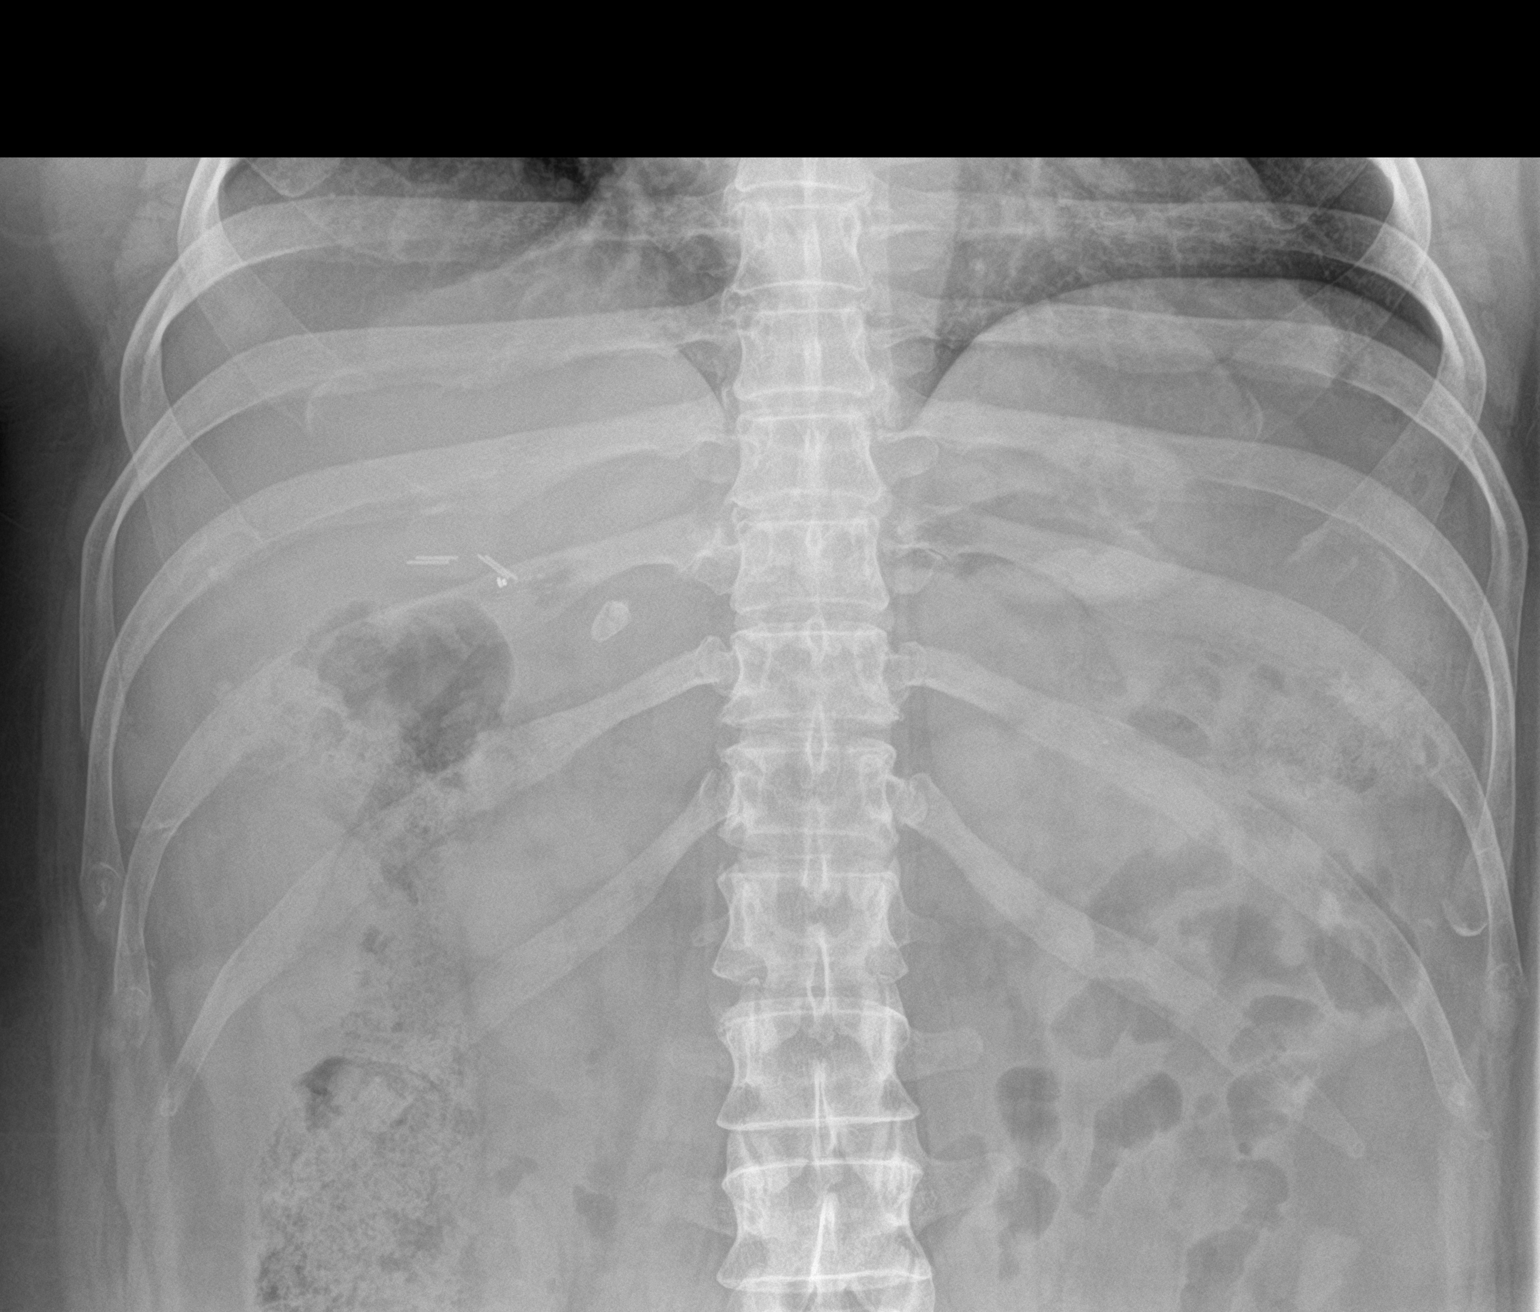

[abdomen kub (2 of 2)]
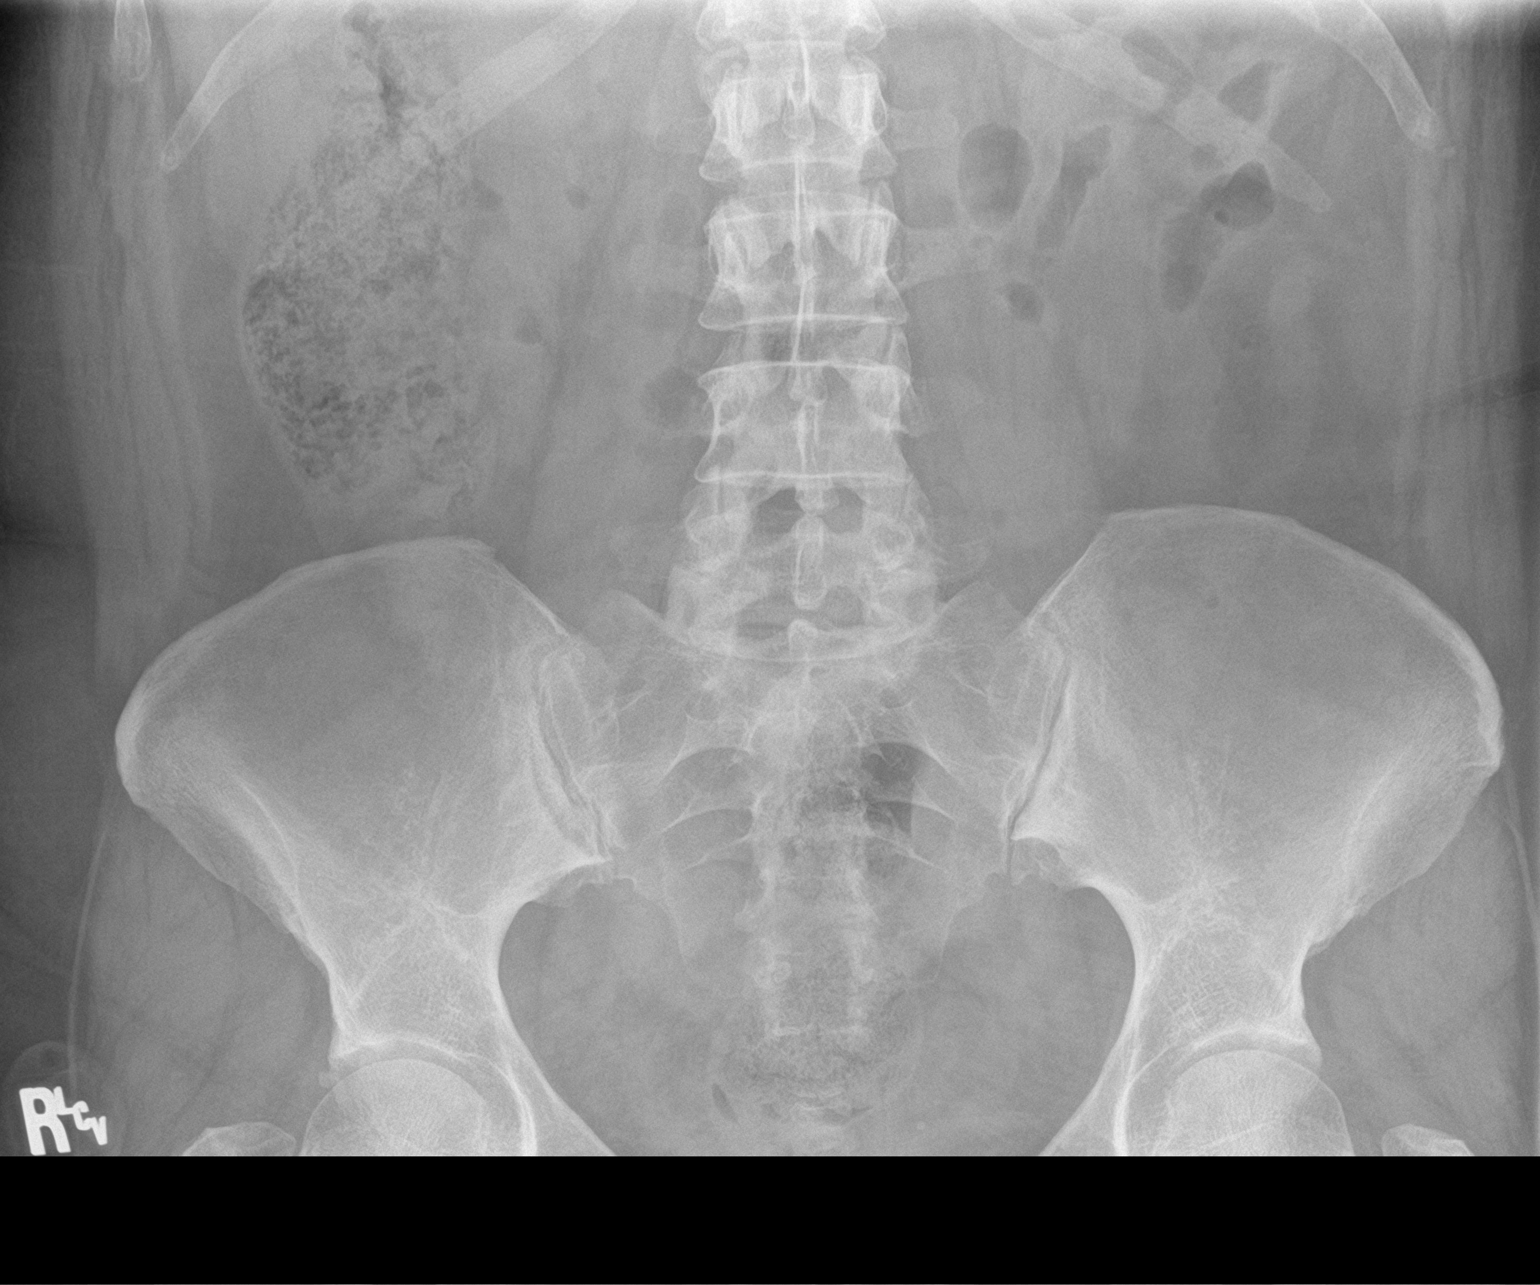

[2 of 2 positions shown; findings below may reference images not displayed]

FINDINGS: Moderate amount of stool in the ascending colon. No bowel dilatation
to suggest obstruction. No evidence of pneumoperitoneum, portal
venous gas or pneumatosis.

No pathologic calcifications along the expected course of the
ureters. Punctate calcification projecting of the left kidney likely
reflecting a left renal calculus. Calcification in the right upper
quadrant likely dystrophic. Surgical clips in the right upper
quadrant from prior cholecystectomy.

No acute osseous abnormality.
IMPRESSION: 1. Punctate calcification projecting of the left kidney likely
reflecting a left renal calculus.

## 2021-01-27 DIAGNOSIS — J069 Acute upper respiratory infection, unspecified: Secondary | ICD-10-CM | POA: Diagnosis not present

## 2021-03-20 ENCOUNTER — Other Ambulatory Visit: Payer: Self-pay

## 2021-03-20 ENCOUNTER — Ambulatory Visit
Admission: EM | Admit: 2021-03-20 | Discharge: 2021-03-20 | Disposition: A | Discharge status: Home / Self Care | Payer: BC Managed Care – PPO | Attending: Emergency Medicine | Admitting: Emergency Medicine

## 2021-03-20 ENCOUNTER — Ambulatory Visit: Payer: Self-pay | Admitting: *Deleted

## 2021-03-20 DIAGNOSIS — H66013 Acute suppurative otitis media with spontaneous rupture of ear drum, bilateral: Secondary | ICD-10-CM | POA: Diagnosis not present

## 2021-03-20 MED ORDER — AMOXICILLIN-POT CLAVULANATE 875-125 MG PO TABS: 1.0000 | ORAL_TABLET | Freq: Two times a day (BID) | ORAL | 0 refills | Status: AC

## 2021-03-20 NOTE — ED Provider Notes (Signed)
?[facility] ? ? ? ?CSN: [id number] ?Arrival date & time: 03/20/21  1000 ? ? ?  ? ?History   ?Chief Complaint ?Chief Complaint  ?Patient presents with  ? Otalgia  ? ? ?HPI ?Regina Schneider is a 46 y.o. female.  ? ?HPI ? ?46 year old female here for evaluation of ENT complaints. ? ?Patient ports that she has been experiencing pain in both ears for the past week with the right being worse than her left.  She is having active drainage from the right ear as well as muffled hearing.  She can still hear from the left ear and she denies any drainage.  She does endorse a headache on the right side of her head and some mild nasal congestion.  No fever or nasal discharge. ? ?Past Medical History:  ?Diagnosis Date  ? Anxiety   ? Kidney stones   ? Kidney stones   ? Sleep apnea   ? ? ?Patient Active Problem List  ? Diagnosis Date Noted  ? Prediabetes 08/22/2020  ? Sleep apnea 07/01/2019  ? GAD (generalized anxiety disorder) 02/24/2019  ? Class 2 severe obesity due to excess calories with serious comorbidity and body mass index (BMI) of 36.0 to 36.9 in adult [facility]) 02/24/2019  ? ? ?Past Surgical History:  ?Procedure Laterality Date  ? CERVICAL ABLATION    ? CESAREAN SECTION    ? 2  ? CHOLECYSTECTOMY    ? FOOT SURGERY Right   ? metatarsal surygery  ? KIDNEY STONE SURGERY    ? TUBAL LIGATION Bilateral 04/29/2012  ? ? ?OB History   ? ? Gravida  ?2  ? Para  ?2  ? Term  ?2  ? Preterm  ?   ? AB  ?   ? Living  ?2  ?  ? ? SAB  ?   ? IAB  ?   ? Ectopic  ?   ? Multiple  ?   ? Live Births  ?   ?   ?  ?  ? ? ? ?Home Medications   ? ?Prior to Admission medications   ?Medication Sig Start Date End Date Taking? Authorizing Provider  ?amoxicillin-clavulanate (AUGMENTIN) 875-125 MG tablet Take 1 tablet by mouth every 12 (twelve) hours for 10 days. 03/20/21 03/30/21 Yes Becky Augusta, NP  ?escitalopram (LEXAPRO) 10 MG tablet TAKE 1 TABLET(10 MG) BY MOUTH DAILY 10/24/20  Yes [name], NP  ?ibuprofen (ADVIL) 200 MG tablet Take by  mouth.   Yes [provider]  ?valACYclovir (VALTREX) 1000 MG tablet To take BID x 7 days as needed 07/23/18  Yes [provider]  ? ? ?Family History ?Family History  ?Problem Relation Age of Onset  ? COPD Mother   ? Dementia Mother   ? Cancer Father   ?     lung and brain stem  ? COPD Father   ? Cancer Maternal Grandmother   ?     breast cancer, 50  ? Diabetes Paternal Grandmother   ? ? ?Social History ?Social History  ? ?Tobacco Use  ? Smoking status: Former  ?  Types: Cigarettes  ?  Quit date: 12/02/2018  ?  Years since quitting: 2.2  ? Smokeless tobacco: Never  ? Tobacco comments:  ?  3-4 cigarettes a day  ?Vaping Use  ? Vaping Use: Never used  ?Substance Use Topics  ? Alcohol use: Yes  ?  Alcohol/week: 1.0 standard drink  ?  Types: 1 Glasses of wine per  week  ?  Comment: twice a week  ? Drug use: Never  ? ? ? ?Allergies   ?Ciprofloxacin and Sulfa antibiotics ? ? ?Review of Systems ?Review of Systems  ?Constitutional:  Negative for fever.  ?HENT:  Positive for congestion, ear discharge, ear pain and hearing loss. Negative for rhinorrhea.   ?Neurological:  Positive for headaches.  ? ? ?Physical Exam ?Triage Vital Signs ?ED Triage Vitals [03/20/21 1039]  ?Enc Vitals Group  ?   BP   ?   Pulse   ?   Resp   ?   Temp   ?   Temp src   ?   SpO2   ?   Weight   ?   Height   ?   Head Circumference   ?   Peak Flow   ?   Pain Score 7  ?   Pain Loc   ?   Pain Edu?   ?   Excl. in GC?   ? ?No data found. ? ?Updated Vital Signs ?BP (!) 135/93 (BP Location: Right Arm)   Pulse 84   Temp 98.2 ?F (36.8 ?C) (Oral)   Resp 18   SpO2 96%  ? ?Visual Acuity ?Right Eye Distance:   ?Left Eye Distance:   ?Bilateral Distance:   ? ?Right Eye Near:   ?Left Eye Near:    ?Bilateral Near:    ? ?Physical Exam ?Vitals and nursing note reviewed.  ?Constitutional:   ?   Appearance: Normal appearance. She is not ill-appearing.  ?HENT:  ?   Head: Normocephalic and atraumatic.  ?   Right Ear: External ear normal. There is no impacted  cerumen.  ?   Left Ear: Ear canal and external ear normal. There is no impacted cerumen.  ?Cardiovascular:  ?   Rate and Rhythm: Normal rate and regular rhythm.  ?   Pulses: Normal pulses.  ?   Heart sounds: Normal heart sounds. No murmur heard. ?  No friction rub. No gallop.  ?Pulmonary:  ?   Effort: Pulmonary effort is normal.  ?   Breath sounds: No wheezing, rhonchi or rales.  ?Skin: ?   General: Skin is warm and dry.  ?   Capillary Refill: Capillary refill takes less than 2 seconds.  ?Neurological:  ?   General: No focal deficit present.  ?   Mental Status: She is alert and oriented to person, place, and time.  ?Psychiatric:     ?   Mood and Affect: Mood normal.     ?   Behavior: Behavior normal.     ?   Thought Content: Thought content normal.     ?   Judgment: Judgment normal.  ? ? ? ?UC Treatments / Results  ?Labs ?(all labs ordered are listed, but only abnormal results are displayed) ?Labs Reviewed - No data to display ? ?EKG ? ? ?Radiology ?No results found. ? ?Procedures ?Procedures (including critical care time) ? ?Medications Ordered in UC ?Medications - No data to display ? ?Initial Impression / Assessment and Plan / UC Course  ?I have reviewed the triage vital signs and the nursing notes. ? ?Pertinent labs & imaging results that were available during my care of the patient were reviewed by me and considered in my medical decision making (see chart for details). ? ?Patient is a very pleasant, nontoxic-appearing 46 year old female here for evaluation of ENT complaints as outlined in HPI above.  Physical exam reveals a mildly erythematous left dependent membrane.  He does appear to be intact and the external auditory canal is normal.  Right tympanic membrane is only visible from the 1:00 to the 8 o'clock position and is erythematous and injected.  There is white debris in the floor of the canal.  There is no apparent erythema or edema of the external auditory canal.  Patient does have some mild pain with  movement of the auricle as well as with palpation of the right eustachian tube externally.  Cardiopulmonary exam reveals clear lung sounds in all fields.  Patient Damas consistent with bilateral otitis media and I suspect that she has a spontaneous rupture of her TM on the right side.  I will place her on Augmentin twice daily for 10 days and I have cautioned her to avoid getting water in her right ear.  I have advised that she use a silicone ear plug or a cottonball coated in Vaseline when she is in the shower or swimming to prevent water from entering her ear.  She should also follow-up with her PCP in 4 weeks to make sure that the hole is healing.  She may also follow-up with ENT.  Work note provided ? ? ?Final Clinical Impressions(s) / UC Diagnoses  ? ?Final diagnoses:  ?Non-recurrent acute suppurative otitis media of both ears with spontaneous rupture of tympanic membranes  ? ? ? ?Discharge Instructions   ? ?  ?Take the Augmentin twice daily for 10 days with food for treatment of your ear infection. ? ?Take an over-the-counter probiotic 1 hour after each dose of antibiotic to prevent diarrhea. ? ?Use over-the-counter Tylenol and ibuprofen as needed for pain or fever. ? ?Place a hot water bottle, or heating pad, underneath your pillowcase at night to help dilate up your ear and aid in pain relief as well as resolution of the infection. ? ?Wear a silicone ear plug to prevent water getting in your ear when swimming or in the shower. ? ?Make a follow-up appointment with your PCP for 4 weeks to ensure that your ear has healed. ? ?Return for reevaluation for any new or worsening symptoms.  ? ? ? ? ?ED Prescriptions   ? ? Medication Sig Dispense Auth. Provider  ? amoxicillin-clavulanate (AUGMENTIN) 875-125 MG tablet Take 1 tablet by mouth every 12 (twelve) hours for 10 days. 20 tablet Becky Augusta, NP  ? ?  ? ?PDMP not reviewed this encounter. ?  ?Becky Augusta, NP ?03/20/21 1107 ? ?

## 2021-03-20 NOTE — ED Triage Notes (Signed)
Pt presents with bilateral otalgia x 1 week.  Both ears are draining.  R ear worse than L and has some muffled hearing.  Feels R is swollen and hurting face.  No fevers, some HA.  ?

## 2021-03-20 NOTE — Telephone Encounter (Signed)
?  Chief Complaint: ear pain with drainage ?Symptoms: 5 days, now very painful ?Frequency: constant, dizzy now ?Pertinent Negatives: Patient denies fever ?Disposition: [] ED /[] Urgent Care (no appt availability in office) / [] Appointment(In office/virtual)/ []  [place] [facility]/ [] Home Care/ [] Refused Recommended Disposition /[]  Mobile Bus/ []  Follow-up with PCP ?Additional Notes: There were no appts today in office, pt going to [facility] on [address] ? ? ?Reason for Disposition ? Ear pain ? ?Answer Assessment - Initial Assessment Questions ?1. LOCATION: "Which ear is involved?"  ?    Right ear worst, but left getting crusty ?2. COLOR: "What is the color of the discharge?"  ?    clear ?3. CONSISTENCY: "How runny is the discharge? Could it be water?"  ?    She does not think so. ?4. ONSET: "When did you first notice the discharge?" ?    5 days ?5. PAIN: "Is there any earache?" "How bad is it?"  (Scale 1-10; or mild, moderate, severe) ?    Yes, 8 ?6. OBJECTS: "Have you put anything in your ear?" (e.g., Q-tip, other object)  ?    Q-tips, not in last two days, usual q-tip user ?7. OTHER SYMPTOMS: "Do you have any other symptoms?" (e.g., headache, fever, dizziness, vomiting, runny nose) ?    Stuffy nose, headache, little dizzy last night, gone now.  ?8. PREGNANCY: "Is there any chance you are pregnant?" "When was your last menstrual period?" ?    na ? ?Protocols used: Ear - Discharge-A-AH ? ?

## 2021-03-20 NOTE — Discharge Instructions (Signed)
Take the Augmentin twice daily for 10 days with food for treatment of your ear infection. ? ?Take an over-the-counter probiotic 1 hour after each dose of antibiotic to prevent diarrhea. ? ?Use over-the-counter Tylenol and ibuprofen as needed for pain or fever. ? ?Place a hot water bottle, or heating pad, underneath your pillowcase at night to help dilate up your ear and aid in pain relief as well as resolution of the infection. ? ?Wear a silicone ear plug to prevent water getting in your ear when swimming or in the shower. ? ?Make a follow-up appointment with your PCP for 4 weeks to ensure that your ear has healed. ? ?Return for reevaluation for any new or worsening symptoms.  ?

## 2021-04-10 ENCOUNTER — Other Ambulatory Visit: Payer: Self-pay | Admitting: Internal Medicine

## 2021-04-10 ENCOUNTER — Encounter: Payer: Self-pay | Admitting: Internal Medicine

## 2021-04-10 ENCOUNTER — Ambulatory Visit: Payer: Self-pay

## 2021-04-10 NOTE — Telephone Encounter (Signed)
?  Chief Complaint: Left ear pain ?Symptoms: Left ear itching ?Frequency: ongoing ?Pertinent Negatives: Patient denies discharge ?Disposition: [] ED /[] Urgent Care (no appt availability in office) / [] Appointment(In office/virtual)/ []  [place] [facility]/ [] Home Care/ [] Refused Recommended Disposition /[] [place] Mobile Bus/ [x]  Follow-up with PCP ?Additional Notes: Pt was seen at [facility] in [facility] for ear pain and rx for Augmentin given. Rt ear has resolved, no left ear is bothering pt. Pt would like a refill on Augmentin.  ?Pt is currently on vacation in [place]. Pt returns tomorrow. ? ?Pt would like rx sent to  ?[facility] [address] ?[place] ?[phone] ? ? ? ?Summary: left ear discomfort / rx request  ? The patient is currently experiencing left ear discomfort  ? ?The patient was previously seen for an ear infection in their right ear and prescribed antibiotics  ? ?The patient has began to notice the same discomfort in their left ear as well and would like to be prescribed something to help  ? ?Please contact further   ?  ? ?Reason for Disposition ? [1] Prescription refill request for ESSENTIAL medicine (i.e., likelihood of harm to patient if not taken) AND [2] triager unable to refill per department policy ? ?Answer Assessment - Initial Assessment Questions ?1. DRUG NAME: "What medicine do you need to have refilled?" ?    Augmentin ?2. REFILLS REMAINING: "How many refills are remaining?" (Note: The label on the medicine or pill bottle will show how many refills are remaining. If there are no refills remaining, then a renewal may be needed.) ?    0 ?3. EXPIRATION DATE: "What is the expiration date?" (Note: The label states when the prescription will expire, and thus can no longer be refilled.) ?    0 ?4. PRESCRIBING HCP: "Who prescribed it?" Reason: If prescribed by specialist, call should be referred to that group. ?    UC  ?5. SYMPTOMS: "Do you have any symptoms?" ?     Left ear pain ?6. PREGNANCY: "Is there any chance that you are pregnant?" "When was your last menstrual period?" ?    na ? ?Protocols used: Medication Refill and Renewal Call-A-AH ? ? ? ? ?

## 2021-04-10 NOTE — Telephone Encounter (Signed)
She has to be seen for this

## 2021-04-10 NOTE — Telephone Encounter (Signed)
Summary: left ear discomfort / rx request  ? The patient is currently experiencing left ear discomfort  ? ?The patient was previously seen for an ear infection in their right ear and prescribed antibiotics  ? ?The patient has began to notice the same discomfort in their left ear as well and would like to be prescribed something to help  ? ?Please contact further  ?  ? ?

## 2021-04-11 ENCOUNTER — Ambulatory Visit
Admission: EM | Admit: 2021-04-11 | Discharge: 2021-04-11 | Disposition: A | Discharge status: Home / Self Care | Payer: BC Managed Care – PPO | Attending: Student | Admitting: Student

## 2021-04-11 ENCOUNTER — Encounter: Payer: Self-pay | Admitting: Emergency Medicine

## 2021-04-11 DIAGNOSIS — H60332 Swimmer's ear, left ear: Secondary | ICD-10-CM

## 2021-04-11 DIAGNOSIS — H66005 Acute suppurative otitis media without spontaneous rupture of ear drum, recurrent, left ear: Secondary | ICD-10-CM

## 2021-04-11 DIAGNOSIS — Z881 Allergy status to other antibiotic agents status: Secondary | ICD-10-CM

## 2021-04-11 MED ORDER — NEOMYCIN-POLYMYXIN-HC 3.5-10000-1 OT SUSP: 4.0000 [drp] | Freq: Three times a day (TID) | OTIC | 0 refills | Status: AC

## 2021-04-11 MED ORDER — FLUCONAZOLE 150 MG PO TABS: 150.0000 mg | ORAL_TABLET | Freq: Every day | ORAL | 0 refills | Status: DC

## 2021-04-11 MED ORDER — CEFDINIR 300 MG PO CAPS: 300.0000 mg | ORAL_CAPSULE | Freq: Two times a day (BID) | ORAL | 0 refills | Status: AC

## 2021-04-11 NOTE — Telephone Encounter (Signed)
Pt advised.  See MyChart message.  ?

## 2021-04-11 NOTE — ED Provider Notes (Signed)
?[facility] ? ? ? ?CSN: [id number] ?Arrival date & time: 04/11/21  1217 ? ? ?  ? ?History   ?Chief Complaint ?Chief Complaint  ?Patient presents with  ? Ear Fullness  ?  Was seen on 3/20 with double ear infection and ruptured right eardrum. Was prescribed augmentin- left ear now showing signs of reptured eardrum. - Entered by patient  ? ? ?HPI ?Regina Schneider is a 46 y.o. female.  Presenting with left ear pain for 4 days following spending time at the beach.  We last saw her on 3/20, at that time she had a bilateral ear infection and a right tympanic membrane perforation.  States the right ear is feeling significantly better, but for the last 4 days describes pain and muffled hearing with some drainage.  Denies fever/chills, dizziness, tinnitus.  She is getting on a plane tomorrow, and wants to be as safe as possible with this. ? ?HPI ? ?Past Medical History:  ?Diagnosis Date  ? Anxiety   ? Kidney stones   ? Kidney stones   ? Sleep apnea   ? ? ?Patient Active Problem List  ? Diagnosis Date Noted  ? Prediabetes 08/22/2020  ? Sleep apnea 07/01/2019  ? GAD (generalized anxiety disorder) 02/24/2019  ? Class 2 severe obesity due to excess calories with serious comorbidity and body mass index (BMI) of 36.0 to 36.9 in adult [facility]) 02/24/2019  ? ? ?Past Surgical History:  ?Procedure Laterality Date  ? CERVICAL ABLATION    ? CESAREAN SECTION    ? 2  ? CHOLECYSTECTOMY    ? FOOT SURGERY Right   ? metatarsal surygery  ? KIDNEY STONE SURGERY    ? TUBAL LIGATION Bilateral 04/29/2012  ? ? ?OB History   ? ? Gravida  ?2  ? Para  ?2  ? Term  ?2  ? Preterm  ?   ? AB  ?   ? Living  ?2  ?  ? ? SAB  ?   ? IAB  ?   ? Ectopic  ?   ? Multiple  ?   ? Live Births  ?   ?   ?  ?  ? ? ? ?Home Medications   ? ?Prior to Admission medications   ?Medication Sig Start Date End Date Taking? Authorizing Provider  ?cefdinir (OMNICEF) 300 MG capsule Take 1 capsule (300 mg total) by mouth 2 (two) times daily for 7 days. 04/11/21 04/18/21 Yes  Rhys Martini, PA-C  ?escitalopram (LEXAPRO) 10 MG tablet TAKE 1 TABLET(10 MG) BY MOUTH DAILY 10/24/20  Yes [name], NP  ?fluconazole (DIFLUCAN) 150 MG tablet Take 1 tablet (150 mg total) by mouth daily. Take if you develop a yeast infection 04/11/21  Yes Rhys Martini, PA-C  ?ibuprofen (ADVIL) 200 MG tablet Take by mouth.   Yes [provider]  ?neomycin-polymyxin-hydrocortisone (CORTISPORIN) 3.5-10000-1 OTIC suspension Place 4 drops into the left ear 3 (three) times daily for 7 days. 04/11/21 04/18/21 Yes Rhys Martini, PA-C  ?valACYclovir (VALTREX) 1000 MG tablet To take BID x 7 days as needed 07/23/18  Yes [provider]  ? ? ?Family History ?Family History  ?Problem Relation Age of Onset  ? COPD Mother   ? Dementia Mother   ? Cancer Father   ?     lung and brain stem  ? COPD Father   ? Cancer Maternal Grandmother   ?     breast cancer, 50  ? Diabetes  Paternal Grandmother   ? ? ?Social History ?Social History  ? ?Tobacco Use  ? Smoking status: Former  ?  Types: Cigarettes  ?  Quit date: 12/02/2018  ?  Years since quitting: 2.3  ? Smokeless tobacco: Never  ? Tobacco comments:  ?  3-4 cigarettes a day  ?Vaping Use  ? Vaping Use: Never used  ?Substance Use Topics  ? Alcohol use: Yes  ?  Alcohol/week: 1.0 standard drink  ?  Types: 1 Glasses of wine per week  ?  Comment: twice a week  ? Drug use: Never  ? ? ? ?Allergies   ?Ciprofloxacin and Sulfa antibiotics ? ? ?Review of Systems ?Review of Systems  ?HENT:  Positive for ear pain.   ?All other systems reviewed and are negative. ? ? ?Physical Exam ?Triage Vital Signs ?ED Triage Vitals  ?Enc Vitals Group  ?   BP 04/11/21 1227 (!) 116/91  ?   Pulse Rate 04/11/21 1227 79  ?   Resp 04/11/21 1227 18  ?   Temp 04/11/21 1227 98.5 ?F (36.9 ?C)  ?   Temp Source 04/11/21 1227 Oral  ?   SpO2 04/11/21 1227 97 %  ?   Weight --   ?   Height --   ?   Head Circumference --   ?   Peak Flow --   ?   Pain Score 04/11/21 1225 4  ?   Pain Loc --   ?   Pain Edu?  --   ?   Excl. in GC? --   ? ?No data found. ? ?Updated Vital Signs ?BP (!) 116/91 (BP Location: Left Arm)   Pulse 79   Temp 98.5 ?F (36.9 ?C) (Oral)   Resp 18   SpO2 97%  ? ?Visual Acuity ?Right Eye Distance:   ?Left Eye Distance:   ?Bilateral Distance:   ? ?Right Eye Near:   ?Left Eye Near:    ?Bilateral Near:    ? ?Physical Exam ?Vitals reviewed.  ?Constitutional:   ?   Appearance: Normal appearance. She is not ill-appearing.  ?HENT:  ?   Head: Normocephalic and atraumatic.  ?   Right Ear: Hearing, ear canal and external ear normal. No swelling or tenderness. No middle ear effusion. There is no impacted cerumen. No mastoid tenderness. Tympanic membrane is retracted. Tympanic membrane is not injected, scarred, perforated, erythematous or bulging.  ?   Left Ear: Hearing, tympanic membrane, ear canal and external ear normal. Drainage, swelling and tenderness present.  No middle ear effusion. There is no impacted cerumen. No mastoid tenderness. Tympanic membrane is not injected, scarred, perforated, erythematous, retracted or bulging.  ?   Mouth/Throat:  ?   Pharynx: Oropharynx is clear. No oropharyngeal exudate or posterior oropharyngeal erythema.  ?Cardiovascular:  ?   Rate and Rhythm: Normal rate and regular rhythm.  ?   Heart sounds: Normal heart sounds.  ?Pulmonary:  ?   Effort: Pulmonary effort is normal.  ?   Breath sounds: Normal breath sounds.  ?Lymphadenopathy:  ?   Cervical: No cervical adenopathy.  ?Neurological:  ?   General: No focal deficit present.  ?   Mental Status: She is alert and oriented to person, place, and time.  ?Psychiatric:     ?   Mood and Affect: Mood normal.     ?   Behavior: Behavior normal.     ?   Thought Content: Thought content normal.     ?   Judgment:  Judgment normal.  ? ? ? ?UC Treatments / Results  ?Labs ?(all labs ordered are listed, but only abnormal results are displayed) ?Labs Reviewed - No data to display ? ?EKG ? ? ?Radiology ?No results  found. ? ?Procedures ?Procedures (including critical care time) ? ?Medications Ordered in UC ?Medications - No data to display ? ?Initial Impression / Assessment and Plan / UC Course  ?I have reviewed the triage vital signs and the nursing notes. ? ?Pertinent labs & imaging results that were available during my care of the patient were reviewed by me and considered in my medical decision making (see chart for details). ? ?  ? ?This patient is a very pleasant 46 y.o. year old female presenting with L otitis externa and AOM. Afebrile, nontachy. Was last treated for bilateral AOM with R TM perforation 03/20/21 with augmentin with resolution of R ear pain. Today on exam, she does have L otitis externa and AOM, but there is no TM perforation. Initially planned to manage with ofloxacin, but as she is allergic to fluoroquinolones, sent cortisporin instead. She understands that this is ototoxic, and if symptoms change or worsen (ie discharge, swelling, hearing changes) that she should discontinue the medication immediately. I also sent omnicef PO today. Diflucan to have on hand. Rec afrin and claritin before her flight tomorrow. F/u with PCP  / ENT when she returns home. ED return precautions discussed. Patient verbalizes understanding and agreement.  ?.  ? ?Final Clinical Impressions(s) / UC Diagnoses  ? ?Final diagnoses:  ?Acute swimmer's ear of left side  ?Recurrent acute suppurative otitis media without spontaneous rupture of left tympanic membrane  ?Allergy to fluoroquinolone  ? ? ? ?Discharge Instructions   ? ?  ?-Start the antibiotic-Omnicef, 1 pill every 12 hours for 7 days.  You can take this with food like with breakfast and dinner. ?-Take a claritin before your flight. Also try using Afrin nasal spray before the flight ?-Cortisporin drops 3x daily x7 days. This medication is not safe if your eardrum is perforated, so if your symptoms change like worsening pain or discharge, stop medication and follow-up with [place] or  PCP. ? ? ? ? ?ED Prescriptions   ? ? Medication Sig Dispense Auth. Provider  ? cefdinir (OMNICEF) 300 MG capsule Take 1 capsule (300 mg total) by mouth 2 (two) times daily for 7 days. 14 capsule Rhys Martini, PA-C  ? fluconazole (DIFL

## 2021-04-11 NOTE — Discharge Instructions (Addendum)
-  Start the antibiotic-Omnicef, 1 pill every 12 hours for 7 days.  You can take this with food like with breakfast and dinner. ?-Take a claritin before your flight. Also try using Afrin nasal spray before the flight ?-Cortisporin drops 3x daily x7 days. This medication is not safe if your eardrum is perforated, so if your symptoms change like worsening pain or discharge, stop medication and follow-up with [place] or PCP. ?

## 2021-04-11 NOTE — ED Triage Notes (Signed)
Pt presents with left ear pain x 4 days.  ?

## 2021-04-19 ENCOUNTER — Encounter: Payer: Self-pay | Admitting: Internal Medicine

## 2021-04-19 DIAGNOSIS — Z1159 Encounter for screening for other viral diseases: Secondary | ICD-10-CM

## 2021-04-19 DIAGNOSIS — Z0000 Encounter for general adult medical examination without abnormal findings: Secondary | ICD-10-CM

## 2021-04-19 DIAGNOSIS — Z114 Encounter for screening for human immunodeficiency virus [HIV]: Secondary | ICD-10-CM

## 2021-04-21 ENCOUNTER — Other Ambulatory Visit: Payer: Managed Care, Other (non HMO)

## 2021-04-21 ENCOUNTER — Ambulatory Visit: Payer: Managed Care, Other (non HMO) | Admitting: Internal Medicine

## 2021-04-21 DIAGNOSIS — Z114 Encounter for screening for human immunodeficiency virus [HIV]: Secondary | ICD-10-CM

## 2021-04-21 DIAGNOSIS — Z1159 Encounter for screening for other viral diseases: Secondary | ICD-10-CM

## 2021-04-21 DIAGNOSIS — Z0000 Encounter for general adult medical examination without abnormal findings: Secondary | ICD-10-CM

## 2021-04-24 DIAGNOSIS — Z114 Encounter for screening for human immunodeficiency virus [HIV]: Secondary | ICD-10-CM | POA: Diagnosis not present

## 2021-04-24 DIAGNOSIS — Z0000 Encounter for general adult medical examination without abnormal findings: Secondary | ICD-10-CM | POA: Diagnosis not present

## 2021-04-24 DIAGNOSIS — Z1159 Encounter for screening for other viral diseases: Secondary | ICD-10-CM | POA: Diagnosis not present

## 2021-04-25 LAB — CBC
HCT: 40.6 % (ref 35.0–45.0)
Hemoglobin: 13.6 g/dL (ref 11.7–15.5)
MCH: 29.8 pg (ref 27.0–33.0)
MCHC: 33.5 g/dL (ref 32.0–36.0)
MCV: 89 fL (ref 80.0–100.0)
MPV: 9.8 fL (ref 7.5–12.5)
Platelets: 316 10*3/uL (ref 140–400)
RBC: 4.56 10*6/uL (ref 3.80–5.10)
RDW: 12.7 % (ref 11.0–15.0)
WBC: 8.6 10*3/uL (ref 3.8–10.8)

## 2021-04-25 LAB — HEPATITIS C ANTIBODY
Hepatitis C Ab: NONREACTIVE
SIGNAL TO CUT-OFF: 0.03 (ref <1.00)

## 2021-04-25 LAB — COMPLETE METABOLIC PANEL WITH GFR
AG Ratio: 1.8 (calc) (ref 1.0–2.5)
ALT: 18 U/L (ref 6–29)
AST: 14 U/L (ref 10–35)
Albumin: 4.6 g/dL (ref 3.6–5.1)
Alkaline phosphatase (APISO): 63 U/L (ref 31–125)
BUN: 13 mg/dL (ref 7–25)
CO2: 26 mmol/L (ref 20–32)
Calcium: 9.9 mg/dL (ref 8.6–10.2)
Chloride: 101 mmol/L (ref 98–110)
Creat: 0.58 mg/dL (ref 0.50–0.99)
Globulin: 2.6 g/dL (calc) (ref 1.9–3.7)
Glucose, Bld: 92 mg/dL (ref 65–99)
Potassium: 4 mmol/L (ref 3.5–5.3)
Sodium: 138 mmol/L (ref 135–146)
Total Bilirubin: 0.4 mg/dL (ref 0.2–1.2)
Total Protein: 7.2 g/dL (ref 6.1–8.1)
eGFR: 112 mL/min/{1.73_m2} (ref >=60)

## 2021-04-25 LAB — LIPID PANEL
Cholesterol: 229 mg/dL — ABNORMAL HIGH (ref <200)
HDL: 46 mg/dL — ABNORMAL LOW (ref >=50)
LDL Cholesterol (Calc): 149 mg/dL (calc) — ABNORMAL HIGH
Non-HDL Cholesterol (Calc): 183 mg/dL (calc) — ABNORMAL HIGH (ref <130)
Total CHOL/HDL Ratio: 5 (calc) — ABNORMAL HIGH (ref <5.0)
Triglycerides: 204 mg/dL — ABNORMAL HIGH (ref <150)

## 2021-04-25 LAB — HIV ANTIBODY (ROUTINE TESTING W REFLEX): HIV 1&2 Ab, 4th Generation: NONREACTIVE

## 2021-04-25 LAB — HEMOGLOBIN A1C
Hgb A1c MFr Bld: 5.7 % of total Hgb — ABNORMAL HIGH (ref <5.7)
Mean Plasma Glucose: 117 mg/dL
eAG (mmol/L): 6.5 mmol/L

## 2021-04-27 NOTE — Addendum Note (Signed)
Addended by: [name] on: 04/27/2021 07:50 AM ? ? Modules accepted: Orders ? ?

## 2021-05-01 ENCOUNTER — Other Ambulatory Visit: Payer: Managed Care, Other (non HMO)

## 2021-05-02 ENCOUNTER — Other Ambulatory Visit: Payer: Managed Care, Other (non HMO)

## 2021-05-04 DIAGNOSIS — Z720 Tobacco use: Secondary | ICD-10-CM | POA: Diagnosis not present

## 2021-06-05 ENCOUNTER — Encounter: Payer: Self-pay | Admitting: Internal Medicine

## 2021-06-05 ENCOUNTER — Ambulatory Visit (INDEPENDENT_AMBULATORY_CARE_PROVIDER_SITE_OTHER): Payer: BC Managed Care – PPO | Admitting: Internal Medicine

## 2021-06-05 VITALS — BP 122/86 | HR 78 | Temp 96.9°F | Ht 65.0 in | Wt 218.0 lb

## 2021-06-05 DIAGNOSIS — F411 Generalized anxiety disorder: Secondary | ICD-10-CM

## 2021-06-05 DIAGNOSIS — G4733 Obstructive sleep apnea (adult) (pediatric): Secondary | ICD-10-CM

## 2021-06-05 DIAGNOSIS — E782 Mixed hyperlipidemia: Secondary | ICD-10-CM

## 2021-06-05 DIAGNOSIS — Z1211 Encounter for screening for malignant neoplasm of colon: Secondary | ICD-10-CM

## 2021-06-05 DIAGNOSIS — Z6836 Body mass index (BMI) 36.0-36.9, adult: Secondary | ICD-10-CM

## 2021-06-05 DIAGNOSIS — Z1231 Encounter for screening mammogram for malignant neoplasm of breast: Secondary | ICD-10-CM

## 2021-06-05 DIAGNOSIS — Z0000 Encounter for general adult medical examination without abnormal findings: Secondary | ICD-10-CM | POA: Diagnosis not present

## 2021-06-05 DIAGNOSIS — R7303 Prediabetes: Secondary | ICD-10-CM

## 2021-06-05 MED ORDER — ESCITALOPRAM OXALATE 10 MG PO TABS: 5.0000 mg | ORAL_TABLET | Freq: Every day | ORAL | 1 refills | Status: DC

## 2021-06-05 MED ORDER — VALACYCLOVIR HCL 1 G PO TABS: ORAL_TABLET | ORAL | 0 refills | Status: DC

## 2021-06-05 NOTE — Assessment & Plan Note (Signed)
Encourage weight loss as this will help reduce sleep apnea symptoms Not currently wearing CPAP

## 2021-06-05 NOTE — Assessment & Plan Note (Signed)
Encouraged her to consume a low-carb diet and exercise weight loss

## 2021-06-05 NOTE — Progress Notes (Signed)
Subjective:    Patient ID: Regina Schneider, female    DOB: 07-21-1975, 46 y.o.   MRN: [id number]  HPI  Patient presents to clinic today for follow-up of chronic conditions.  OSA: She averages 7 hours of sleep per night without the use of her CPAP.  There is no sleep study on file.  Anxiety: She is not currently taking any medications for this but would like to get started back on her Escitalopram.  She is not currently seeing a therapist.  She denies depression, SI/HI.  Prediabetes: Her last A1c was 5.7%, 04/2021.  She is not taking any oral diabetic medication at this time.  She does not check her sugars.  HLD: Her last LDL was 149, triglycerides 204, 04/2021.  She is not taking any cholesterol-lowering medication at this time.  She does not consume a low-fat diet.  Review of Systems     Past Medical History:  Diagnosis Date   Anxiety    Kidney stones    Kidney stones    Sleep apnea     Current Outpatient Medications  Medication Sig Dispense Refill   escitalopram (LEXAPRO) 10 MG tablet TAKE 1 TABLET(10 MG) BY MOUTH DAILY 90 tablet 0   fluconazole (DIFLUCAN) 150 MG tablet Take 1 tablet (150 mg total) by mouth daily. Take if you develop a yeast infection 1 tablet 0   ibuprofen (ADVIL) 200 MG tablet Take by mouth.     valACYclovir (VALTREX) 1000 MG tablet To take BID x 7 days as needed     No current facility-administered medications for this visit.    Allergies  Allergen Reactions   Ciprofloxacin     Hot flashes and abd pain   Sulfa Antibiotics Hives    Family History  Problem Relation Age of Onset   COPD Mother    Dementia Mother    Cancer Father        lung and brain stem   COPD Father    Cancer Maternal Grandmother        breast cancer, 50   Diabetes Paternal Grandmother     Social History   Socioeconomic History   Marital status: Unknown    Spouse name: Not on file   Number of children: Not on file   Years of education: Not on file   Highest  education level: Not on file  Occupational History   Not on file  Tobacco Use   Smoking status: Former    Types: Cigarettes    Quit date: 12/02/2018    Years since quitting: 2.5   Smokeless tobacco: Never   Tobacco comments:    3-4 cigarettes a day  Vaping Use   Vaping Use: Never used  Substance and Sexual Activity   Alcohol use: Yes    Alcohol/week: 1.0 standard drink    Types: 1 Glasses of wine per week    Comment: twice a week   Drug use: Never   Sexual activity: Yes    Birth control/protection: None  Other Topics Concern   Not on file  Social History Narrative   Not on file   Social Determinants of Health   Financial Resource Strain: Not on file  Food Insecurity: Not on file  Transportation Needs: Not on file  Physical Activity: Not on file  Stress: Not on file  Social Connections: Not on file  Intimate Partner Violence: Not on file     Constitutional: Denies fever, malaise, fatigue, headache or abrupt weight changes.  HEENT:  Denies eye pain, eye redness, ear pain, ringing in the ears, wax buildup, runny nose, nasal congestion, bloody nose, or sore throat. Respiratory: Denies difficulty breathing, shortness of breath, cough or sputum production.   Cardiovascular: Denies chest pain, chest tightness, palpitations or swelling in the hands or feet.  Gastrointestinal: Denies abdominal pain, bloating, constipation, diarrhea or blood in the stool.  GU: Denies urgency, frequency, pain with urination, burning sensation, blood in urine, odor or discharge. Musculoskeletal: Denies decrease in range of motion, difficulty with gait, muscle pain or joint pain and swelling.  Skin: Denies redness, rashes, lesions or ulcercations.  Neurological: Denies dizziness, difficulty with memory, difficulty with speech or problems with balance and coordination.  Psych: Patient reports anxiety.  Denies depression, SI/HI.  No other specific complaints in a complete review of systems (except as  listed in HPI above).  Objective:   Physical Exam  BP 122/86 (BP Location: Right Arm, Patient Position: Sitting, Cuff Size: Normal)   Pulse 78   Temp (!) 96.9 F (36.1 C) (Temporal)   Ht 5\' 5"  (1.651 m)   Wt 218 lb (98.9 kg)   SpO2 98%   BMI 36.28 kg/m   Wt Readings from Last 3 Encounters:  08/22/20 226 lb (102.5 kg)  07/05/20 224 lb 9.6 oz (101.9 kg)  08/10/19 223 lb 11.2 oz (101.5 kg)    General: Appears her stated age, obese, in NAD. Skin: Warm, dry and intact. HEENT: Head: normal shape and size; Eyes: sclera white, no icterus, conjunctiva pink, PERRLA and EOMs intact;  Cardiovascular: Normal rate and rhythm. S1,S2 noted.  No murmur, rubs or gallops noted.  Pulmonary/Chest: Normal effort and positive vesicular breath sounds. No respiratory distress.  Neurological: Alert and oriented.  Psychiatric: Mood and affect normal. Behavior is normal. Judgment and thought content normal.    BMET    Component Value Date/Time   NA 138 04/24/2021 1321   K 4.0 04/24/2021 1321   CL 101 04/24/2021 1321   CO2 26 04/24/2021 1321   GLUCOSE 92 04/24/2021 1321   BUN 13 04/24/2021 1321   CREATININE 0.58 04/24/2021 1321   CALCIUM 9.9 04/24/2021 1321    Lipid Panel     Component Value Date/Time   CHOL 229 (H) 04/24/2021 1321   TRIG 204 (H) 04/24/2021 1321   HDL 46 (L) 04/24/2021 1321   CHOLHDL 5.0 (H) 04/24/2021 1321   LDLCALC 149 (H) 04/24/2021 1321    CBC    Component Value Date/Time   WBC 8.6 04/24/2021 1321   RBC 4.56 04/24/2021 1321   HGB 13.6 04/24/2021 1321   HCT 40.6 04/24/2021 1321   PLT 316 04/24/2021 1321   MCV 89.0 04/24/2021 1321   MCH 29.8 04/24/2021 1321   MCHC 33.5 04/24/2021 1321   RDW 12.7 04/24/2021 1321    Hgb A1C Lab Results  Component Value Date   HGBA1C 5.7 (H) 04/24/2021          Assessment & Plan:     04/26/2021, NP

## 2021-06-05 NOTE — Patient Instructions (Signed)

## 2021-06-05 NOTE — Assessment & Plan Note (Signed)
Encourage diet and exercise for weight loss 

## 2021-06-05 NOTE — Assessment & Plan Note (Signed)
We will restart Escitalopram Support offered

## 2021-06-06 ENCOUNTER — Telehealth: Payer: Self-pay

## 2021-06-06 ENCOUNTER — Other Ambulatory Visit: Payer: Self-pay

## 2021-06-06 DIAGNOSIS — Z1211 Encounter for screening for malignant neoplasm of colon: Secondary | ICD-10-CM

## 2021-06-06 LAB — TSH+FREE T4: TSH W/REFLEX TO FT4: 0.87 mIU/L

## 2021-06-06 MED ORDER — GOLYTELY 236 G PO SOLR
4000.0000 mL | Freq: Once | ORAL | 0 refills | Status: AC
Start: 2021-06-06 — End: 2021-06-06

## 2021-06-06 NOTE — Telephone Encounter (Signed)
Gastroenterology Pre-Procedure Review  Request Date: 12/04/21 Requesting Physician: Dr. Marius Ditch  PATIENT REVIEW QUESTIONS: The patient responded to the following health history questions as indicated:    1. Are you having any GI issues? no 2. Do you have a personal history of Polyps? no 3. Do you have a family history of Colon Cancer or Polyps? no 4. Diabetes Mellitus? no 5. Joint replacements in the past 12 months?no 6. Major health problems in the past 3 months?no 7. Any artificial heart valves, MVP, or defibrillator?no    MEDICATIONS & ALLERGIES:    Patient reports the following regarding taking any anticoagulation/antiplatelet therapy:   Plavix, Coumadin, Eliquis, Xarelto, Lovenox, Pradaxa, Brilinta, or Effient? no Aspirin? no  Patient confirms/reports the following medications:  Current Outpatient Medications  Medication Sig Dispense Refill   escitalopram (LEXAPRO) 10 MG tablet Take 0.5 tablets (5 mg total) by mouth daily. 45 tablet 1   ibuprofen (ADVIL) 200 MG tablet Take by mouth.     valACYclovir (VALTREX) 1000 MG tablet To take BID x 7 days as needed 15 tablet 0   No current facility-administered medications for this visit.    Patient confirms/reports the following allergies:  Allergies  Allergen Reactions   Ciprofloxacin     Hot flashes and abd pain   Sulfa Antibiotics Hives    No orders of the defined types were placed in this encounter.   AUTHORIZATION INFORMATION Primary Insurance: 1D#: Group #:  Secondary Insurance: 1D#: Group #:  SCHEDULE INFORMATION: Date: 12/04/21 Time: Location: [facility]

## 2021-08-09 ENCOUNTER — Telehealth: Payer: Self-pay | Admitting: Internal Medicine

## 2021-08-09 NOTE — Telephone Encounter (Signed)
Rx 06/05/21 #45 1RF -6 month supply  Requested Prescriptions  Pending Prescriptions Disp Refills  . escitalopram (LEXAPRO) 10 MG tablet 45 tablet 1    Sig: Take 0.5 tablets (5 mg total) by mouth daily.     Psychiatry:  Antidepressants - SSRI Passed - 08/09/2021 12:03 PM      Passed - Valid encounter within last 6 months    Recent Outpatient Visits          2 months ago Prediabetes   [facility] [place], Salvadore Oxford, NP   11 months ago GAD (generalized anxiety disorder)   [facility] Salvadore Oxford, NP   1 year ago History of kidney stones   [facility] [place], [place], NP   2 years ago Acute cystitis with hematuria   [facility] Smitty Cords, DO   2 years ago Encounter to establish care with new doctor   [facility], Jodelle Gross, [place]

## 2021-08-09 NOTE — Telephone Encounter (Signed)
Medication Refill - Medication: escitalopram (LEXAPRO) 10 MG tablet  Has the patient contacted their pharmacy? No. Patient states medication does not reflect on her My Chart to reflect medication    Preferred Pharmacy (with phone number or street name):   [id number] - [place], [place] - [address] Phone:  [phone]  Fax:  [phone]     Has the patient been seen for an appointment in the last year OR does the patient have an upcoming appointment? Yes.    Agent: Please be advised that RX refills may take up to 3 business days. We ask that you follow-up with your pharmacy.

## 2021-08-22 NOTE — Telephone Encounter (Signed)
She has refills at the pharmacy.  She needs to call the pharmacy

## 2021-08-22 NOTE — Telephone Encounter (Signed)
Pt stated she is completely out of her medication. Pt stated medication was never picked up back on 06/05/21. I reached out to the pharmacy directly to ask about medication; however, I was unable to speak with anyone.  Please advise.

## 2021-09-29 ENCOUNTER — Ambulatory Visit
Admission: EM | Admit: 2021-09-29 | Discharge: 2021-09-29 | Disposition: A | Discharge status: Home / Self Care | Payer: BC Managed Care – PPO

## 2021-09-29 ENCOUNTER — Ambulatory Visit: Admit: 2021-09-29 | Discharge status: Absent without leave | Payer: BC Managed Care – PPO

## 2021-09-29 DIAGNOSIS — H60392 Other infective otitis externa, left ear: Secondary | ICD-10-CM | POA: Diagnosis not present

## 2021-09-29 MED ORDER — CIPROFLOXACIN-DEXAMETHASONE 0.3-0.1 % OT SUSP: 4.0000 [drp] | Freq: Two times a day (BID) | OTIC | 0 refills | Status: AC

## 2021-09-29 NOTE — ED Provider Notes (Addendum)
error   Rose Phi, FNP 09/29/21 1946    ImmordinoAnnie Main, [place] 09/29/21 1946

## 2021-09-29 NOTE — Discharge Instructions (Signed)
Follow up here or with your primary care provider if your symptoms are worsening or not improving with treatment.     

## 2021-09-29 NOTE — Discharge Instructions (Addendum)
Follow up here or with your primary care provider if your symptoms are worsening or not improving with treatment.  As we discussed, you may need to be treated by ENT with placement of an ear wick.

## 2021-09-29 NOTE — ED Triage Notes (Signed)
Pt. States that she has been having left ear fullness and left ear pain since Monday. Pt. Has a hx of bursting her right ear drum and treated it w/ guaifenesin . Pt states she took one pill of guaifenesin last night to treat the ar fullness.

## 2021-09-29 NOTE — ED Provider Notes (Signed)
[facility]    CSN: [id number] Arrival date & time: 09/29/21  1736      History   Chief Complaint Chief Complaint  Patient presents with   Ear Fullness   Otalgia    HPI Regina Schneider is a 46 y.o. female.    Ear Fullness  Otalgia   Presents to urgent care with complaint of otalgia and ear fullness times X days.  She endorses history of TM rupture bilaterally after infection.  States she feels itching in her right ear and feeling of fullness, water in her ear on the left.  Past Medical History:  Diagnosis Date   Anxiety    Kidney stones    Kidney stones    Sleep apnea     Patient Active Problem List   Diagnosis Date Noted   Mixed hyperlipidemia 06/05/2021   Prediabetes 08/22/2020   Sleep apnea 07/01/2019   GAD (generalized anxiety disorder) 02/24/2019   Class 2 severe obesity due to excess calories with serious comorbidity and body mass index (BMI) of 36.0 to 36.9 in adult [facility]) 02/24/2019    Past Surgical History:  Procedure Laterality Date   CERVICAL ABLATION     CESAREAN SECTION     2   CHOLECYSTECTOMY     FOOT SURGERY Right    metatarsal surygery   KIDNEY STONE SURGERY     TUBAL LIGATION Bilateral 04/29/2012    OB History     Gravida  2   Para  2   Term  2   Preterm      AB      Living  2      SAB      IAB      Ectopic      Multiple      Live Births               Home Medications    Prior to Admission medications   Medication Sig Start Date End Date Taking? Authorizing Provider  acetaminophen-codeine (TYLENOL #3) 300-30 MG tablet Take 1 tablet every 6 hours by oral route. 06/22/19  Yes [provider]  traMADol (ULTRAM) 50 MG tablet Take 1 tablet every 6 hours by oral route. 06/19/19  Yes [provider]  cephALEXin (KEFLEX) 500 MG capsule TAKE ONE CAPSULE BY MOUTH TWICE A DAY FOR 7 DAYS    [provider]  cyclobenzaprine (FLEXERIL) 10 MG tablet     [provider]   escitalopram (LEXAPRO) 10 MG tablet Take 0.5 tablets (5 mg total) by mouth daily. 06/05/21   Jearld Fenton, NP  ibuprofen (ADVIL) 200 MG tablet Take by mouth.    [provider]  [facility] COVID-19 RAPID TEST KIT  05/01/21   [provider]  nitrofurantoin (MACRODANTIN) 50 MG capsule     [provider]  valACYclovir (VALTREX) 1000 MG tablet To take BID x 7 days as needed 06/05/21   Jearld Fenton, NP    Family History Family History  Problem Relation Age of Onset   COPD Mother    Dementia Mother    Cancer Father        lung and brain stem   COPD Father    Cancer Maternal Grandmother        breast cancer, 55   Diabetes Paternal Grandmother     Social History Social History   Tobacco Use   Smoking status: Former    Types: Cigarettes    Quit date: 12/02/2018  Years since quitting: 2.8   Smokeless tobacco: Never   Tobacco comments:    3-4 cigarettes a day  Vaping Use   Vaping Use: Never used  Substance Use Topics   Alcohol use: Yes    Alcohol/week: 1.0 standard drink of alcohol    Types: 1 Glasses of wine per week    Comment: twice a week   Drug use: Never     Allergies   Ciprofloxacin and Sulfa antibiotics   Review of Systems Review of Systems  HENT:  Positive for ear pain.      Physical Exam Triage Vital Signs ED Triage Vitals  Enc Vitals Group     BP      Pulse      Resp      Temp      Temp src      SpO2      Weight      Height      Head Circumference      Peak Flow      Pain Score      Pain Loc      Pain Edu?      Excl. in [place]?    No data found.  Updated Vital Signs There were no vitals taken for this visit.  Visual Acuity Right Eye Distance:   Left Eye Distance:   Bilateral Distance:    Right Eye Near:   Left Eye Near:    Bilateral Near:     Physical Exam Vitals reviewed.  Constitutional:      Appearance: Normal appearance.  HENT:     Right Ear: Tympanic membrane and ear canal normal.     Left Ear:  Swelling and tenderness present.     Ears:     Comments: Left EAC is extremely edematous and tender to touch.  Able to observe TM. Skin:    General: Skin is warm and dry.  Neurological:     General: No focal deficit present.     Mental Status: She is alert and oriented to person, place, and time.  Psychiatric:        Mood and Affect: Mood normal.        Behavior: Behavior normal.      UC Treatments / Results  Labs (all labs ordered are listed, but only abnormal results are displayed) Labs Reviewed - No data to display  EKG   Radiology No results found.  Procedures Procedures (including critical care time)  Medications Ordered in UC Medications - No data to display  Initial Impression / Assessment and Plan / UC Course  I have reviewed the triage vital signs and the nursing notes.  Pertinent labs & imaging results that were available during my care of the patient were reviewed by me and considered in my medical decision making (see chart for details).   Left otitis externa.  EAC is extremely edematous and tender.  Given her concern about left TM rupture, will avoid aminoglycosides and acidic formulations.  Prescribing Ciprodex.  Informed her that due to the severe inflammation in her EAC, she may need to be treated by ENT with placement of an ear wick.   Final Clinical Impressions(s) / UC Diagnoses   Final diagnoses:  None   Discharge Instructions   None    ED Prescriptions   None    PDMP not reviewed this encounter.   Rose Phi, [place] 09/29/21 1821

## 2021-10-09 ENCOUNTER — Encounter: Payer: Self-pay | Admitting: Emergency Medicine

## 2021-10-09 ENCOUNTER — Ambulatory Visit
Admission: EM | Admit: 2021-10-09 | Discharge: 2021-10-09 | Disposition: A | Discharge status: Home / Self Care | Payer: BC Managed Care – PPO | Attending: Physician Assistant | Admitting: Physician Assistant

## 2021-10-09 DIAGNOSIS — N3001 Acute cystitis with hematuria: Secondary | ICD-10-CM | POA: Diagnosis not present

## 2021-10-09 DIAGNOSIS — R300 Dysuria: Secondary | ICD-10-CM | POA: Insufficient documentation

## 2021-10-09 LAB — URINALYSIS, ROUTINE W REFLEX MICROSCOPIC
Bilirubin Urine: NEGATIVE
Glucose, UA: NEGATIVE mg/dL
Nitrite: NEGATIVE
Protein, ur: 30 mg/dL — AB
Specific Gravity, Urine: 1.025 (ref 1.005–1.030)
pH: 6 (ref 5.0–8.0)

## 2021-10-09 LAB — URINALYSIS, MICROSCOPIC (REFLEX): WBC, UA: >50 WBC/hpf (ref 0–5)

## 2021-10-09 MED ORDER — PHENAZOPYRIDINE HCL 200 MG PO TABS: 200.0000 mg | ORAL_TABLET | Freq: Three times a day (TID) | ORAL | 0 refills | Status: DC

## 2021-10-09 MED ORDER — CEPHALEXIN 500 MG PO CAPS: 500.0000 mg | ORAL_CAPSULE | Freq: Two times a day (BID) | ORAL | 0 refills | Status: AC

## 2021-10-09 NOTE — ED Triage Notes (Signed)
Pt presents with dysuria, urinary frequency x 6-7 days. Today she has diarrhea and a heaviness in her stomach.

## 2021-10-09 NOTE — Discharge Instructions (Signed)

## 2021-10-09 NOTE — ED Provider Notes (Signed)
[facility]    CSN: [id number] Arrival date & time: 10/09/21  1602      History   Chief Complaint Chief Complaint  Patient presents with   Urinary Frequency    Stomach pain, diarrhea, pain in pressure in pelvic area- probably a UTI. - Entered by patient    HPI Regina Schneider is a 46 y.o. female presenting for dysuria, urinary frequency and urgency as well as hematuria over the past week.  She also reports that today she started to get some diarrhea and stomach cramping.  She has not had any fevers.  Denies any fatigue, flank pain.  Patient denies any vaginal discharge, itching or odor.  She believes she has a urinary tract infection.   HPI  Past Medical History:  Diagnosis Date   Anxiety    Kidney stones    Kidney stones    Sleep apnea     Patient Active Problem List   Diagnosis Date Noted   Mixed hyperlipidemia 06/05/2021   Prediabetes 08/22/2020   Sleep apnea 07/01/2019   GAD (generalized anxiety disorder) 02/24/2019   Class 2 severe obesity due to excess calories with serious comorbidity and body mass index (BMI) of 36.0 to 36.9 in adult [facility]) 02/24/2019    Past Surgical History:  Procedure Laterality Date   CERVICAL ABLATION     CESAREAN SECTION     2   CHOLECYSTECTOMY     FOOT SURGERY Right    metatarsal surygery   KIDNEY STONE SURGERY     TUBAL LIGATION Bilateral 04/29/2012    OB History     Gravida  2   Para  2   Term  2   Preterm      AB      Living  2      SAB      IAB      Ectopic      Multiple      Live Births               Home Medications    Prior to Admission medications   Medication Sig Start Date End Date Taking? Authorizing Provider  cephALEXin (KEFLEX) 500 MG capsule Take 1 capsule (500 mg total) by mouth 2 (two) times daily for 7 days. 10/09/21 10/16/21 Yes Danton Clap, PA-C  phenazopyridine (PYRIDIUM) 200 MG tablet Take 1 tablet (200 mg total) by mouth 3 (three) times daily. 10/09/21  Yes  Laurene Footman B, PA-C  acetaminophen-codeine (TYLENOL #3) 300-30 MG tablet Take 1 tablet every 6 hours by oral route. 06/22/19   [provider]  cyclobenzaprine (FLEXERIL) 10 MG tablet     [provider]  escitalopram (LEXAPRO) 10 MG tablet Take 0.5 tablets (5 mg total) by mouth daily. 06/05/21   Jearld Fenton, NP  ibuprofen (ADVIL) 200 MG tablet Take by mouth.    [provider]  IHEALTH COVID-19 RAPID TEST KIT  05/01/21   [provider]  nitrofurantoin (MACRODANTIN) 50 MG capsule     [provider]  traMADol (ULTRAM) 50 MG tablet Take 1 tablet every 6 hours by oral route. 06/19/19   [provider]  valACYclovir (VALTREX) 1000 MG tablet To take BID x 7 days as needed 06/05/21   Jearld Fenton, NP    Family History Family History  Problem Relation Age of Onset   COPD Mother    Dementia Mother    Cancer Father  lung and brain stem   COPD Father    Cancer Maternal Grandmother        breast cancer, 92   Diabetes Paternal Grandmother     Social History Social History   Tobacco Use   Smoking status: Former    Types: Cigarettes    Quit date: 12/02/2018    Years since quitting: 2.8   Smokeless tobacco: Never   Tobacco comments:    3-4 cigarettes a day  Vaping Use   Vaping Use: Never used  Substance Use Topics   Alcohol use: Yes    Alcohol/week: 1.0 standard drink of alcohol    Types: 1 Glasses of wine per week    Comment: twice a week   Drug use: Never     Allergies   Ciprofloxacin and Sulfa antibiotics   Review of Systems Review of Systems  Constitutional:  Negative for chills, fatigue and fever.  Gastrointestinal:  Positive for diarrhea. Negative for abdominal pain, nausea and vomiting.  Genitourinary:  Positive for dysuria, frequency and urgency. Negative for decreased urine volume, flank pain, hematuria, pelvic pain, vaginal bleeding, vaginal discharge and vaginal pain.  Musculoskeletal:  Negative for back  pain.  Skin:  Negative for rash.     Physical Exam Triage Vital Signs ED Triage Vitals  Enc Vitals Group     BP 10/09/21 1643 112/74     Pulse Rate 10/09/21 1643 77     Resp 10/09/21 1643 16     Temp 10/09/21 1643 98.1 F (36.7 C)     Temp Source 10/09/21 1643 Oral     SpO2 10/09/21 1643 100 %     Weight --      Height --      Head Circumference --      Peak Flow --      Pain Score 10/09/21 1641 7     Pain Loc --      Pain Edu? --      Excl. in [place]? --    No data found.  Updated Vital Signs BP 112/74 (BP Location: Left Arm)   Pulse 77   Temp 98.1 F (36.7 C) (Oral)   Resp 16   SpO2 100%       Physical Exam Vitals and nursing note reviewed.  Constitutional:      General: She is not in acute distress.    Appearance: Normal appearance. She is not ill-appearing or toxic-appearing.  HENT:     Head: Normocephalic and atraumatic.  Eyes:     General: No scleral icterus.       Right eye: No discharge.        Left eye: No discharge.     Conjunctiva/sclera: Conjunctivae normal.  Cardiovascular:     Rate and Rhythm: Normal rate and regular rhythm.     Heart sounds: Normal heart sounds.  Pulmonary:     Effort: Pulmonary effort is normal. No respiratory distress.     Breath sounds: Normal breath sounds.  Abdominal:     Palpations: Abdomen is soft.     Tenderness: There is abdominal tenderness (generalized, lower). There is no right CVA tenderness or left CVA tenderness.  Musculoskeletal:     Cervical back: Neck supple.  Skin:    General: Skin is dry.  Neurological:     General: No focal deficit present.     Mental Status: She is alert. Mental status is at baseline.     Motor: No weakness.     Gait: Gait  normal.  Psychiatric:        Mood and Affect: Mood normal.        Behavior: Behavior normal.        Thought Content: Thought content normal.      UC Treatments / Results  Labs (all labs ordered are listed, but only abnormal results are displayed) Labs  Reviewed  URINALYSIS, ROUTINE W REFLEX MICROSCOPIC - Abnormal; Notable for the following components:      Result Value   APPearance HAZY (*)    Hgb urine dipstick TRACE (*)    Ketones, ur TRACE (*)    Protein, ur 30 (*)    Leukocytes,Ua MODERATE (*)    All other components within normal limits  URINALYSIS, MICROSCOPIC (REFLEX) - Abnormal; Notable for the following components:   Bacteria, UA FEW (*)    All other components within normal limits  URINE CULTURE    EKG   Radiology No results found.  Procedures Procedures (including critical care time)  Medications Ordered in UC Medications - No data to display  Initial Impression / Assessment and Plan / UC Course  I have reviewed the triage vital signs and the nursing notes.  Pertinent labs & imaging results that were available during my care of the patient were reviewed by me and considered in my medical decision making (see chart for details).   46 year old female presenting for 1 week history of dysuria, Rosanne Gutting frequency and urgency and hematuria.  Vitals normal stable.  Patient overall well-appearing.  Abdomen soft with some tenderness throughout the lower abdomen.  No guarding or rebound.  No CVA tenderness.  Chest clear to auscultation and heart regular rate and rhythm.  Urinalysis shows hazy urine with trace hemoglobin and ketones, protein and moderate leukocytes with white blood cell clumps and bacteria.  We will send urine for culture but this is consistent with a urinary tract infection.  Patient has been treated with Keflex in the past and prefers that medication.  Sent that to the pharmacy as well as Pyridium and encouraged her to increase her rest and fluids.  Reviewed that we will alter antibiotic based on culture if needed.  Advised her to follow-up with [place] as needed.   Final Clinical Impressions(s) / UC Diagnoses   Final diagnoses:  Acute cystitis with hematuria  Dysuria     Discharge Instructions      UTI:  Based on either symptoms or urinalysis, you may have a urinary tract infection. We will send the urine for culture and call with results in a few days. Begin antibiotics at this time. Your symptoms should be much improved over the next 2-3 days. Increase rest and fluid intake. If for some reason symptoms are worsening or not improving after a couple of days or the urine culture determines the antibiotics you are taking will not treat the infection, the antibiotics may be changed. Return or go to ER for fever, back pain, worsening urinary pain, discharge, increased blood in urine. May take Tylenol or Motrin OTC for pain relief or consider AZO if no contraindications      ED Prescriptions     Medication Sig Dispense Auth. Provider   cephALEXin (KEFLEX) 500 MG capsule Take 1 capsule (500 mg total) by mouth 2 (two) times daily for 7 days. 14 capsule Laurene Footman B, PA-C   phenazopyridine (PYRIDIUM) 200 MG tablet Take 1 tablet (200 mg total) by mouth 3 (three) times daily. 6 tablet Danton Clap, PA-C  PDMP not reviewed this encounter.   Danton Clap, PA-C 10/09/21 1724

## 2021-10-11 ENCOUNTER — Ambulatory Visit (INDEPENDENT_AMBULATORY_CARE_PROVIDER_SITE_OTHER): Payer: BC Managed Care – PPO | Admitting: Internal Medicine

## 2021-10-11 ENCOUNTER — Encounter: Payer: Self-pay | Admitting: Internal Medicine

## 2021-10-11 VITALS — BP 114/82 | HR 77 | Temp 96.9°F | Wt 207.0 lb

## 2021-10-11 DIAGNOSIS — N3000 Acute cystitis without hematuria: Secondary | ICD-10-CM

## 2021-10-11 DIAGNOSIS — Z87442 Personal history of urinary calculi: Secondary | ICD-10-CM

## 2021-10-11 DIAGNOSIS — H60399 Other infective otitis externa, unspecified ear: Secondary | ICD-10-CM

## 2021-10-11 NOTE — Patient Instructions (Signed)
Urinary Tract Infection, Adult A urinary tract infection (UTI) is an infection of any part of the urinary tract. The urinary tract includes: The kidneys. The ureters. The bladder. The urethra. These organs make, store, and get rid of pee (urine) in the body. What are the causes? This infection is caused by germs (bacteria) in your genital area. These germs grow and cause swelling (inflammation) of your urinary tract. What increases the risk? The following factors may make you more likely to develop this condition: Using a small, thin tube (catheter) to drain pee. Not being able to control when you pee or poop (incontinence). Being female. If you are female, these things can increase the risk: Using these methods to prevent pregnancy: A medicine that kills sperm (spermicide). A device that blocks sperm (diaphragm). Having low levels of a female hormone (estrogen). Being pregnant. You are more likely to develop this condition if: You have genes that add to your risk. You are sexually active. You take antibiotic medicines. You have trouble peeing because of: A prostate that is bigger than normal, if you are female. A blockage in the part of your body that drains pee from the bladder. A kidney stone. A nerve condition that affects your bladder. Not getting enough to drink. Not peeing often enough. You have other conditions, such as: Diabetes. A weak disease-fighting system (immune system). Sickle cell disease. Gout. Injury of the spine. What are the signs or symptoms? Symptoms of this condition include: Needing to pee right away. Peeing small amounts often. Pain or burning when peeing. Blood in the pee. Pee that smells bad or not like normal. Trouble peeing. Pee that is cloudy. Fluid coming from the vagina, if you are female. Pain in the belly or lower back. Other symptoms include: Vomiting. Not feeling hungry. Feeling mixed up (confused). This may be the first symptom in  older adults. Being tired and grouchy (irritable). A fever. Watery poop (diarrhea). How is this treated? Taking antibiotic medicine. Taking other medicines. Drinking enough water. In some cases, you may need to see a specialist. Follow these instructions at home:  Medicines Take over-the-counter and prescription medicines only as told by your doctor. If you were prescribed an antibiotic medicine, take it as told by your doctor. Do not stop taking it even if you start to feel better. General instructions Make sure you: Pee until your bladder is empty. Do not hold pee for a long time. Empty your bladder after sex. Wipe from front to back after peeing or pooping if you are a female. Use each tissue one time when you wipe. Drink enough fluid to keep your pee pale yellow. Keep all follow-up visits. Contact a doctor if: You do not get better after 1-2 days. Your symptoms go away and then come back. Get help right away if: You have very bad back pain. You have very bad pain in your lower belly. You have a fever. You have chills. You feeling like you will vomit or you vomit. Summary A urinary tract infection (UTI) is an infection of any part of the urinary tract. This condition is caused by germs in your genital area. There are many risk factors for a UTI. Treatment includes antibiotic medicines. Drink enough fluid to keep your pee pale yellow. This information is not intended to replace advice given to you by your health care provider. Make sure you discuss any questions you have with your health care provider. Document Revised: 07/31/2019 Document Reviewed: 07/31/2019 Elsevier Patient Education    2023 Elsevier Inc.  

## 2021-10-11 NOTE — Progress Notes (Signed)
Subjective:    Patient ID: Regina Schneider, female    DOB: Jul 11, 1975, 46 y.o.   MRN: [id number]  HPI  Patient presents to clinic today for multiple urgent care follow-up.  She presented to the urgent care 9/29 with complaint of ear pain.  She was diagnosed with infective otitis externa and treated with Ciprodex. She reports she never picked up the ear drops and her ear pain has since resolved. She subsequently presented to the urgent care 10/9 with complaint of urgency, frequency and dysuria.  She was diagnosed with a UTI and treated with Keflex. She reports her urinary symptoms are better.  She would also like a referral to urology.  She reports she has a history of a kidney stone that did not resolve with lithotripsy.  She has not followed up on this since she lived in [place] 4 years ago.  Review of Systems  Past Medical History:  Diagnosis Date   Anxiety    Kidney stones    Kidney stones    Sleep apnea     Current Outpatient Medications  Medication Sig Dispense Refill   acetaminophen-codeine (TYLENOL #3) 300-30 MG tablet Take 1 tablet every 6 hours by oral route.     cephALEXin (KEFLEX) 500 MG capsule Take 1 capsule (500 mg total) by mouth 2 (two) times daily for 7 days. 14 capsule 0   cyclobenzaprine (FLEXERIL) 10 MG tablet      escitalopram (LEXAPRO) 10 MG tablet Take 0.5 tablets (5 mg total) by mouth daily. 45 tablet 1   ibuprofen (ADVIL) 200 MG tablet Take by mouth.     IHEALTH COVID-19 RAPID TEST KIT      nitrofurantoin (MACRODANTIN) 50 MG capsule      phenazopyridine (PYRIDIUM) 200 MG tablet Take 1 tablet (200 mg total) by mouth 3 (three) times daily. 6 tablet 0   traMADol (ULTRAM) 50 MG tablet Take 1 tablet every 6 hours by oral route.     valACYclovir (VALTREX) 1000 MG tablet To take BID x 7 days as needed 15 tablet 0   No current facility-administered medications for this visit.    Allergies  Allergen Reactions   Ciprofloxacin     Hot flashes and abd pain    Sulfa Antibiotics Hives    Family History  Problem Relation Age of Onset   COPD Mother    Dementia Mother    Cancer Father        lung and brain stem   COPD Father    Cancer Maternal Grandmother        breast cancer, 52   Diabetes Paternal Grandmother     Social History   Socioeconomic History   Marital status: Unknown    Spouse name: Not on file   Number of children: Not on file   Years of education: Not on file   Highest education level: Not on file  Occupational History   Not on file  Tobacco Use   Smoking status: Former    Types: Cigarettes    Quit date: 12/02/2018    Years since quitting: 2.8   Smokeless tobacco: Never   Tobacco comments:    3-4 cigarettes a day  Vaping Use   Vaping Use: Never used  Substance and Sexual Activity   Alcohol use: Yes    Alcohol/week: 1.0 standard drink of alcohol    Types: 1 Glasses of wine per week    Comment: twice a week   Drug use: Never   Sexual  activity: Yes    Birth control/protection: None  Other Topics Concern   Not on file  Social History Narrative   Not on file   Social Determinants of Health   Financial Resource Strain: Not on file  Food Insecurity: Not on file  Transportation Needs: Not on file  Physical Activity: Not on file  Stress: Not on file  Social Connections: Not on file  Intimate Partner Violence: Not on file     Constitutional: Denies fever, malaise, fatigue, headache or abrupt weight changes.  HEENT: Denies eye pain, eye redness, ear pain, ringing in the ears, wax buildup, runny nose, nasal congestion, bloody nose, or sore throat. Respiratory: Denies difficulty breathing, shortness of breath, cough or sputum production.   Cardiovascular: Denies chest pain, chest tightness, palpitations or swelling in the hands or feet.  Gastrointestinal: Patient reports intermittent flank pain.  Denies abdominal pain, bloating, constipation, diarrhea or blood in the stool.  GU: Denies urgency, frequency, pain  with urination, burning sensation, blood in urine, odor or discharge. Musculoskeletal: Denies decrease in range of motion, difficulty with gait, muscle pain or joint pain and swelling.  Skin: Denies redness, rashes, lesions or ulcercations.   No other specific complaints in a complete review of systems (except as listed in HPI above).     Objective:   Physical Exam   BP 114/82 (BP Location: Right Arm, Patient Position: Sitting, Cuff Size: Normal)   Pulse 77   Temp (!) 96.9 F (36.1 C) (Temporal)   Wt 207 lb (93.9 kg)   SpO2 98%   BMI 34.45 kg/m   Wt Readings from Last 3 Encounters:  06/05/21 218 lb (98.9 kg)  08/22/20 226 lb (102.5 kg)  07/05/20 224 lb 9.6 oz (101.9 kg)    General: Appears her stated age, obese in NAD. Skin: Warm, dry and intact. HEENT: Head: normal shape and size; Eyes: EOMs intact; Cardiovascular: Normal rate and rhythm.  Pulmonary/Chest: Normal effort and positive vesicular breath sounds.  Neurological: Alert and oriented.   BMET    Component Value Date/Time   NA 138 04/24/2021 1321   K 4.0 04/24/2021 1321   CL 101 04/24/2021 1321   CO2 26 04/24/2021 1321   GLUCOSE 92 04/24/2021 1321   BUN 13 04/24/2021 1321   CREATININE 0.58 04/24/2021 1321   CALCIUM 9.9 04/24/2021 1321    Lipid Panel     Component Value Date/Time   CHOL 229 (H) 04/24/2021 1321   TRIG 204 (H) 04/24/2021 1321   HDL 46 (L) 04/24/2021 1321   CHOLHDL 5.0 (H) 04/24/2021 1321   LDLCALC 149 (H) 04/24/2021 1321    CBC    Component Value Date/Time   WBC 8.6 04/24/2021 1321   RBC 4.56 04/24/2021 1321   HGB 13.6 04/24/2021 1321   HCT 40.6 04/24/2021 1321   PLT 316 04/24/2021 1321   MCV 89.0 04/24/2021 1321   MCH 29.8 04/24/2021 1321   MCHC 33.5 04/24/2021 1321   RDW 12.7 04/24/2021 1321    Hgb A1C Lab Results  Component Value Date   HGBA1C 5.7 (H) 04/24/2021           Assessment & Plan:   Urgent Care Follow Up for Infective Otitis Externa, UTI:  Urgent  care notes and labs reviewed Her ear symptoms have fully resolved Her UTI symptoms are improving, encouraged her to finish her course of Keflex as prescribed Push fluids  History of Kidney Stones:  Referral to urology placed   RTC in 2 months  for annual exam Webb Silversmith, NP

## 2021-10-12 LAB — URINE CULTURE: Culture: >=100000 — AB

## 2021-11-30 ENCOUNTER — Telehealth: Payer: Self-pay

## 2021-11-30 NOTE — Telephone Encounter (Signed)
Received a message from [place] at our [facility] office stating that patient has requested to reschedule her colonoscopy.  Attempted to contact patient but had to lvm for her.  Will await call back to reschedule her 12/04/21 colonoscopy with Dr. Allegra Lai. Currently scheduled for 12/04/21 at [facility].  Thanks,  [place], [place]

## 2021-12-04 ENCOUNTER — Encounter: Payer: Self-pay | Admitting: Registered Nurse

## 2021-12-04 ENCOUNTER — Ambulatory Visit
Admission: RE | Admit: 2021-12-04 | Payer: BC Managed Care – PPO | Source: Ambulatory Visit | Admitting: Gastroenterology

## 2021-12-04 ENCOUNTER — Encounter: Admission: RE | Payer: Self-pay | Source: Ambulatory Visit

## 2021-12-04 SURGERY — COLONOSCOPY WITH PROPOFOL
Anesthesia: General

## 2022-02-23 ENCOUNTER — Other Ambulatory Visit: Payer: Self-pay

## 2022-02-23 ENCOUNTER — Telehealth: Payer: Self-pay

## 2022-02-23 ENCOUNTER — Ambulatory Visit (INDEPENDENT_AMBULATORY_CARE_PROVIDER_SITE_OTHER): Payer: BC Managed Care – PPO | Admitting: Internal Medicine

## 2022-02-23 ENCOUNTER — Encounter: Payer: Self-pay | Admitting: Internal Medicine

## 2022-02-23 VITALS — BP 124/84 | HR 85 | Temp 96.8°F | Ht 65.0 in | Wt 214.0 lb

## 2022-02-23 DIAGNOSIS — Z1211 Encounter for screening for malignant neoplasm of colon: Secondary | ICD-10-CM

## 2022-02-23 DIAGNOSIS — Z0001 Encounter for general adult medical examination with abnormal findings: Secondary | ICD-10-CM | POA: Diagnosis not present

## 2022-02-23 DIAGNOSIS — E782 Mixed hyperlipidemia: Secondary | ICD-10-CM

## 2022-02-23 DIAGNOSIS — R7303 Prediabetes: Secondary | ICD-10-CM

## 2022-02-23 DIAGNOSIS — E288 Other ovarian dysfunction: Secondary | ICD-10-CM | POA: Diagnosis not present

## 2022-02-23 DIAGNOSIS — N951 Menopausal and female climacteric states: Secondary | ICD-10-CM

## 2022-02-23 MED ORDER — GOLYTELY 236 G PO SOLR: 4000.0000 mL | Freq: Once | ORAL | 0 refills | Status: AC

## 2022-02-23 MED ORDER — ESCITALOPRAM OXALATE 10 MG PO TABS: 5.0000 mg | ORAL_TABLET | Freq: Every day | ORAL | 1 refills | Status: DC

## 2022-02-23 NOTE — Assessment & Plan Note (Signed)
Encourage diet and exercise for weight loss 

## 2022-02-23 NOTE — Progress Notes (Signed)
Subjective:    Patient ID: Regina Schneider, female    DOB: 09-23-1975, 47 y.o.   MRN: [id number]  HPI  Patient presents to clinic today for annual exam.  Flu: 10/2018 Tetanus: 02/2018 COVID: Alphonsa Overall x 1 Pap smear: 05/2019 Mammogram: 2019, scheduled 04/2022 Colon screening: Never Vision screening: annually Dentist: biannually  Diet: She does eat meat. She consumes fruits and veggies. She tries to avoid fried foods. She drinks mostly coffee, water, unsweet tea or Dt. Soda. Exercise: Walking  Review of Systems     Past Medical History:  Diagnosis Date   Anxiety    Kidney stones    Kidney stones    Sleep apnea     Current Outpatient Medications  Medication Sig Dispense Refill   acetaminophen-codeine (TYLENOL #3) 300-30 MG tablet Take 1 tablet every 6 hours by oral route.     cyclobenzaprine (FLEXERIL) 10 MG tablet      escitalopram (LEXAPRO) 10 MG tablet Take 0.5 tablets (5 mg total) by mouth daily. 45 tablet 1   ibuprofen (ADVIL) 200 MG tablet Take by mouth.     traMADol (ULTRAM) 50 MG tablet Take 1 tablet every 6 hours by oral route.     valACYclovir (VALTREX) 1000 MG tablet To take BID x 7 days as needed 15 tablet 0   No current facility-administered medications for this visit.    Allergies  Allergen Reactions   Ciprofloxacin     Hot flashes and abd pain   Sulfa Antibiotics Hives    Family History  Problem Relation Age of Onset   COPD Mother    Dementia Mother    Cancer Father        lung and brain stem   COPD Father    Cancer Maternal Grandmother        breast cancer, 72   Diabetes Paternal Grandmother     Social History   Socioeconomic History   Marital status: Married    Spouse name: Not on file   Number of children: Not on file   Years of education: Not on file   Highest education level: Not on file  Occupational History   Not on file  Tobacco Use   Smoking status: Former    Types: Cigarettes    Quit date: 12/02/2018    Years since  quitting: 3.2   Smokeless tobacco: Never   Tobacco comments:    3-4 cigarettes a day  Vaping Use   Vaping Use: Never used  Substance and Sexual Activity   Alcohol use: Yes    Alcohol/week: 1.0 standard drink of alcohol    Types: 1 Glasses of wine per week    Comment: twice a week   Drug use: Never   Sexual activity: Yes    Birth control/protection: None  Other Topics Concern   Not on file  Social History Narrative   Not on file   Social Determinants of Health   Financial Resource Strain: Not on file  Food Insecurity: Not on file  Transportation Needs: Not on file  Physical Activity: Not on file  Stress: Not on file  Social Connections: Not on file  Intimate Partner Violence: Not on file     Constitutional: Denies fever, malaise, fatigue, headache or abrupt weight changes.  HEENT: Denies eye pain, eye redness, ear pain, ringing in the ears, wax buildup, runny nose, nasal congestion, bloody nose, or sore throat. Respiratory: Denies difficulty breathing, shortness of breath, cough or sputum production.   Cardiovascular: Denies  chest pain, chest tightness, palpitations or swelling in the hands or feet.  Gastrointestinal: Denies abdominal pain, bloating, constipation, diarrhea or blood in the stool.  GU: Pt reports amenorrhea. Denies urgency, frequency, pain with urination, burning sensation, blood in urine, odor or discharge. Musculoskeletal: Denies decrease in range of motion, difficulty with gait, muscle pain or joint pain and swelling.  Skin: Denies redness, rashes, lesions or ulcercations.  Neurological: Denies dizziness, difficulty with memory, difficulty with speech or problems with balance and coordination.  Psych: Patient has a history of anxiety.  Denies depression, SI/HI.  No other specific complaints in a complete review of systems (except as listed in HPI above).  Objective:   Physical Exam  BP 124/84 (BP Location: Left Arm, Patient Position: Sitting, Cuff Size:  Large)   Pulse 85   Temp (!) 96.8 F (36 C) (Temporal)   Ht 5' 5"$  (1.651 m)   Wt 214 lb (97.1 kg)   SpO2 98%   BMI 35.61 kg/m   Wt Readings from Last 3 Encounters:  10/11/21 207 lb (93.9 kg)  06/05/21 218 lb (98.9 kg)  08/22/20 226 lb (102.5 kg)    General: Appears her stated age, obese, in NAD. Skin: Warm, dry and intact. No rashes, lesions or ulcerations noted. HEENT: Head: normal shape and size; Eyes: sclera white, no icterus, conjunctiva pink, PERRLA and EOMs intact;  Neck:  Neck supple, trachea midline. No masses, lumps or thyromegaly present.  Cardiovascular: Normal rate and rhythm. S1,S2 noted.  No murmur, rubs or gallops noted. No JVD or BLE edema.  Pulmonary/Chest: Normal effort and positive vesicular breath sounds. No respiratory distress. No wheezes, rales or ronchi noted.  Abdomen: Soft and nontender. Normal bowel sounds.  Musculoskeletal: Strength 5/5 BUE/BLE. No difficulty with gait.  Neurological: Alert and oriented. Cranial nerves II-XII grossly intact. Coordination normal.  Psychiatric: Mood and affect normal. Behavior is normal. Judgment and thought content normal.    BMET    Component Value Date/Time   NA 138 04/24/2021 1321   K 4.0 04/24/2021 1321   CL 101 04/24/2021 1321   CO2 26 04/24/2021 1321   GLUCOSE 92 04/24/2021 1321   BUN 13 04/24/2021 1321   CREATININE 0.58 04/24/2021 1321   CALCIUM 9.9 04/24/2021 1321    Lipid Panel     Component Value Date/Time   CHOL 229 (H) 04/24/2021 1321   TRIG 204 (H) 04/24/2021 1321   HDL 46 (L) 04/24/2021 1321   CHOLHDL 5.0 (H) 04/24/2021 1321   LDLCALC 149 (H) 04/24/2021 1321    CBC    Component Value Date/Time   WBC 8.6 04/24/2021 1321   RBC 4.56 04/24/2021 1321   HGB 13.6 04/24/2021 1321   HCT 40.6 04/24/2021 1321   PLT 316 04/24/2021 1321   MCV 89.0 04/24/2021 1321   MCH 29.8 04/24/2021 1321   MCHC 33.5 04/24/2021 1321   RDW 12.7 04/24/2021 1321    Hgb A1C Lab Results  Component Value Date    HGBA1C 5.7 (H) 04/24/2021           Assessment & Plan:   Preventative Health Maintenance:  Encouraged her to get a flu shot in the fall Tetanus UTD Encouraged her to get her COVID booster Pap smear UTD Mammogram scheduled Referral to GI for screening colonoscopy Encouraged her to consume a balanced diet and exercise regimen Advised her to see an eye doctor and dentist annually We will check CBC, c-Met, lipid, A1c today  RTC in 6 months, follow-up chronic  conditions Webb Silversmith, NP

## 2022-02-23 NOTE — Telephone Encounter (Signed)
Gastroenterology Pre-Procedure Review  Request Date: 03/23/22 Requesting Physician: Dr. Vicente Males  PATIENT REVIEW QUESTIONS: The patient responded to the following health history questions as indicated:    1. Are you having any GI issues? no 2. Do you have a personal history of Polyps? no 3. Do you have a family history of Colon Cancer or Polyps? no 4. Diabetes Mellitus? no 5. Joint replacements in the past 12 months?no 6. Major health problems in the past 3 months?no 7. Any artificial heart valves, MVP, or defibrillator?no    MEDICATIONS & ALLERGIES:    Patient reports the following regarding taking any anticoagulation/antiplatelet therapy:   Plavix, Coumadin, Eliquis, Xarelto, Lovenox, Pradaxa, Brilinta, or Effient? no Aspirin? no  Patient confirms/reports the following medications:  Current Outpatient Medications  Medication Sig Dispense Refill   acetaminophen-codeine (TYLENOL #3) 300-30 MG tablet Take 1 tablet every 6 hours by oral route.     cyclobenzaprine (FLEXERIL) 10 MG tablet      escitalopram (LEXAPRO) 10 MG tablet Take 0.5 tablets (5 mg total) by mouth daily. 45 tablet 1   ibuprofen (ADVIL) 200 MG tablet Take by mouth.     traMADol (ULTRAM) 50 MG tablet Take 1 tablet every 6 hours by oral route.     valACYclovir (VALTREX) 1000 MG tablet To take BID x 7 days as needed 15 tablet 0   No current facility-administered medications for this visit.    Patient confirms/reports the following allergies:  Allergies  Allergen Reactions   Ciprofloxacin     Hot flashes and abd pain   Sulfa Antibiotics Hives    No orders of the defined types were placed in this encounter.   AUTHORIZATION INFORMATION Primary Insurance: 1D#: Group #:  Secondary Insurance: 1D#: Group #:  SCHEDULE INFORMATION: Date: 03/23/22 Time: Location: [facility]

## 2022-02-23 NOTE — Patient Instructions (Signed)
Health Maintenance for Postmenopausal Women Menopause is a normal process in which your ability to get pregnant comes to an end. This process happens slowly over many months or years, usually between the ages of 48 and 55. Menopause is complete when you have missed your menstrual period for 12 months. It is important to talk with your health care provider about some of the most common conditions that affect women after menopause (postmenopausal women). These include heart disease, cancer, and bone loss (osteoporosis). Adopting a healthy lifestyle and getting preventive care can help to promote your health and wellness. The actions you take can also lower your chances of developing some of these common conditions. What are the signs and symptoms of menopause? During menopause, you may have the following symptoms: Hot flashes. These can be moderate or severe. Night sweats. Decrease in sex drive. Mood swings. Headaches. Tiredness (fatigue). Irritability. Memory problems. Problems falling asleep or staying asleep. Talk with your health care provider about treatment options for your symptoms. Do I need hormone replacement therapy? Hormone replacement therapy is effective in treating symptoms that are caused by menopause, such as hot flashes and night sweats. Hormone replacement carries certain risks, especially as you become older. If you are thinking about using estrogen or estrogen with progestin, discuss the benefits and risks with your health care provider. How can I reduce my risk for heart disease and stroke? The risk of heart disease, heart attack, and stroke increases as you age. One of the causes may be a change in the body's hormones during menopause. This can affect how your body uses dietary fats, triglycerides, and cholesterol. Heart attack and stroke are medical emergencies. There are many things that you can do to help prevent heart disease and stroke. Watch your blood pressure High  blood pressure causes heart disease and increases the risk of stroke. This is more likely to develop in people who have high blood pressure readings or are overweight. Have your blood pressure checked: Every 3-5 years if you are 18-39 years of age. Every year if you are 40 years old or older. Eat a healthy diet  Eat a diet that includes plenty of vegetables, fruits, low-fat dairy products, and lean protein. Do not eat a lot of foods that are high in solid fats, added sugars, or sodium. Get regular exercise Get regular exercise. This is one of the most important things you can do for your health. Most adults should: Try to exercise for at least 150 minutes each week. The exercise should increase your heart rate and make you sweat (moderate-intensity exercise). Try to do strengthening exercises at least twice each week. Do these in addition to the moderate-intensity exercise. Spend less time sitting. Even light physical activity can be beneficial. Other tips Work with your health care provider to achieve or maintain a healthy weight. Do not use any products that contain nicotine or tobacco. These products include cigarettes, chewing tobacco, and vaping devices, such as e-cigarettes. If you need help quitting, ask your health care provider. Know your numbers. Ask your health care provider to check your cholesterol and your blood sugar (glucose). Continue to have your blood tested as directed by your health care provider. Do I need screening for cancer? Depending on your health history and family history, you may need to have cancer screenings at different stages of your life. This may include screening for: Breast cancer. Cervical cancer. Lung cancer. Colorectal cancer. What is my risk for osteoporosis? After menopause, you may be   at increased risk for osteoporosis. Osteoporosis is a condition in which bone destruction happens more quickly than new bone creation. To help prevent osteoporosis or  the bone fractures that can happen because of osteoporosis, you may take the following actions: If you are 19-50 years old, get at least 1,000 mg of calcium and at least 600 international units (IU) of vitamin D per day. If you are older than age 50 but younger than age 70, get at least 1,200 mg of calcium and at least 600 international units (IU) of vitamin D per day. If you are older than age 70, get at least 1,200 mg of calcium and at least 800 international units (IU) of vitamin D per day. Smoking and drinking excessive alcohol increase the risk of osteoporosis. Eat foods that are rich in calcium and vitamin D, and do weight-bearing exercises several times each week as directed by your health care provider. How does menopause affect my mental health? Depression may occur at any age, but it is more common as you become older. Common symptoms of depression include: Feeling depressed. Changes in sleep patterns. Changes in appetite or eating patterns. Feeling an overall lack of motivation or enjoyment of activities that you previously enjoyed. Frequent crying spells. Talk with your health care provider if you think that you are experiencing any of these symptoms. General instructions See your health care provider for regular wellness exams and vaccines. This may include: Scheduling regular health, dental, and eye exams. Getting and maintaining your vaccines. These include: Influenza vaccine. Get this vaccine each year before the flu season begins. Pneumonia vaccine. Shingles vaccine. Tetanus, diphtheria, and pertussis (Tdap) booster vaccine. Your health care provider may also recommend other immunizations. Tell your health care provider if you have ever been abused or do not feel safe at home. Summary Menopause is a normal process in which your ability to get pregnant comes to an end. This condition causes hot flashes, night sweats, decreased interest in sex, mood swings, headaches, or lack  of sleep. Treatment for this condition may include hormone replacement therapy. Take actions to keep yourself healthy, including exercising regularly, eating a healthy diet, watching your weight, and checking your blood pressure and blood sugar levels. Get screened for cancer and depression. Make sure that you are up to date with all your vaccines. This information is not intended to replace advice given to you by your health care provider. Make sure you discuss any questions you have with your health care provider. Document Revised: 05/09/2020 Document Reviewed: 05/09/2020 Elsevier Patient Education  2023 [employer] Inc[employer]  

## 2022-02-24 LAB — COMPLETE METABOLIC PANEL WITH GFR
AG Ratio: 1.8 (calc) (ref 1.0–2.5)
ALT: 29 U/L (ref 6–29)
AST: 22 U/L (ref 10–35)
Albumin: 4.4 g/dL (ref 3.6–5.1)
Alkaline phosphatase (APISO): 62 U/L (ref 31–125)
BUN: 12 mg/dL (ref 7–25)
CO2: 27 mmol/L (ref 20–32)
Calcium: 9.3 mg/dL (ref 8.6–10.2)
Chloride: 104 mmol/L (ref 98–110)
Creat: 0.56 mg/dL (ref 0.50–0.99)
Globulin: 2.4 g/dL (calc) (ref 1.9–3.7)
Glucose, Bld: 89 mg/dL (ref 65–99)
Potassium: 4.3 mmol/L (ref 3.5–5.3)
Sodium: 139 mmol/L (ref 135–146)
Total Bilirubin: 0.4 mg/dL (ref 0.2–1.2)
Total Protein: 6.8 g/dL (ref 6.1–8.1)
eGFR: 113 mL/min/{1.73_m2} (ref >=60)

## 2022-02-24 LAB — CBC
HCT: 39.4 % (ref 35.0–45.0)
Hemoglobin: 13.1 g/dL (ref 11.7–15.5)
MCH: 29.4 pg (ref 27.0–33.0)
MCHC: 33.2 g/dL (ref 32.0–36.0)
MCV: 88.3 fL (ref 80.0–100.0)
MPV: 10.2 fL (ref 7.5–12.5)
Platelets: 291 10*3/uL (ref 140–400)
RBC: 4.46 10*6/uL (ref 3.80–5.10)
RDW: 12.7 % (ref 11.0–15.0)
WBC: 7.5 10*3/uL (ref 3.8–10.8)

## 2022-02-24 LAB — LIPID PANEL
Cholesterol: 234 mg/dL — ABNORMAL HIGH (ref <200)
HDL: 49 mg/dL — ABNORMAL LOW (ref >=50)
LDL Cholesterol (Calc): 147 mg/dL (calc) — ABNORMAL HIGH
Non-HDL Cholesterol (Calc): 185 mg/dL (calc) — ABNORMAL HIGH (ref <130)
Total CHOL/HDL Ratio: 4.8 (calc) (ref <5.0)
Triglycerides: 237 mg/dL — ABNORMAL HIGH (ref <150)

## 2022-02-24 LAB — FSH/LH
FSH: 3.9 m[IU]/mL
LH: 2.6 m[IU]/mL

## 2022-02-24 LAB — HEMOGLOBIN A1C
Hgb A1c MFr Bld: 5.7 % of total Hgb — ABNORMAL HIGH (ref <5.7)
Mean Plasma Glucose: 117 mg/dL
eAG (mmol/L): 6.5 mmol/L

## 2022-02-26 ENCOUNTER — Encounter: Payer: Self-pay | Admitting: Internal Medicine

## 2022-02-26 ENCOUNTER — Other Ambulatory Visit: Payer: Self-pay | Admitting: Internal Medicine

## 2022-02-26 DIAGNOSIS — Z1231 Encounter for screening mammogram for malignant neoplasm of breast: Secondary | ICD-10-CM

## 2022-02-26 DIAGNOSIS — E782 Mixed hyperlipidemia: Secondary | ICD-10-CM

## 2022-02-26 MED ORDER — ATORVASTATIN CALCIUM 10 MG PO TABS: 10.0000 mg | ORAL_TABLET | Freq: Every day | ORAL | 1 refills | Status: DC

## 2022-03-13 ENCOUNTER — Telehealth: Payer: Self-pay

## 2022-03-13 ENCOUNTER — Encounter: Payer: Self-pay | Admitting: Internal Medicine

## 2022-03-13 DIAGNOSIS — Z1211 Encounter for screening for malignant neoplasm of colon: Secondary | ICD-10-CM

## 2022-03-13 NOTE — Telephone Encounter (Signed)
Returned patients call to reschedule her colonoscopy.  She has requested to have Cologuard test instead and is in the process of discussing with her PCP.  Thanks, [place], [place]

## 2022-03-13 NOTE — Telephone Encounter (Signed)
Patient is calling to reschedule her colonoscopy that is schedule for 03/23/2022 with Dr. Vicente Males.

## 2022-03-23 ENCOUNTER — Encounter: Admission: RE | Payer: Self-pay | Source: Home / Self Care

## 2022-03-23 ENCOUNTER — Ambulatory Visit
Admission: RE | Admit: 2022-03-23 | Payer: BC Managed Care – PPO | Source: Home / Self Care | Admitting: Gastroenterology

## 2022-03-23 SURGERY — COLONOSCOPY WITH PROPOFOL
Anesthesia: General

## 2022-03-27 DIAGNOSIS — Z1211 Encounter for screening for malignant neoplasm of colon: Secondary | ICD-10-CM | POA: Diagnosis not present

## 2022-04-08 LAB — COLOGUARD: COLOGUARD: NEGATIVE

## 2022-04-09 DIAGNOSIS — Z1231 Encounter for screening mammogram for malignant neoplasm of breast: Secondary | ICD-10-CM

## 2022-04-16 ENCOUNTER — Encounter: Payer: Self-pay | Admitting: Internal Medicine

## 2022-05-03 ENCOUNTER — Ambulatory Visit
Admission: RE | Admit: 2022-05-03 | Discharge: 2022-05-03 | Disposition: A | Discharge status: Home / Self Care | Payer: BC Managed Care – PPO | Source: Ambulatory Visit | Attending: Physician Assistant | Admitting: Physician Assistant

## 2022-05-03 ENCOUNTER — Encounter: Payer: Self-pay | Admitting: Internal Medicine

## 2022-05-03 VITALS — BP 110/74 | HR 100 | Temp 98.7°F | Resp 16

## 2022-05-03 DIAGNOSIS — J069 Acute upper respiratory infection, unspecified: Secondary | ICD-10-CM

## 2022-05-03 DIAGNOSIS — R051 Acute cough: Secondary | ICD-10-CM | POA: Diagnosis not present

## 2022-05-03 DIAGNOSIS — R0981 Nasal congestion: Secondary | ICD-10-CM

## 2022-05-03 DIAGNOSIS — J029 Acute pharyngitis, unspecified: Secondary | ICD-10-CM | POA: Diagnosis not present

## 2022-05-03 LAB — GROUP A STREP BY PCR: Group A Strep by PCR: NOT DETECTED

## 2022-05-03 LAB — SARS CORONAVIRUS 2 BY RT PCR: SARS Coronavirus 2 by RT PCR: NEGATIVE

## 2022-05-03 NOTE — ED Triage Notes (Signed)
Pt c/o cough, nasal congestion, sneezing, head congestion x 3 days.

## 2022-05-03 NOTE — Discharge Instructions (Signed)
URI/COLD SYMPTOMS: Your exam today is consistent with a viral illness. Antibiotics are not indicated at this time. Use medications as directed, including cough syrup, nasal saline, and decongestants. Your symptoms should improve over the next few days and resolve within 7-10 days. Increase rest and fluids. F/u if symptoms worsen or predominate such as sore throat, ear pain, productive cough, shortness of breath, or if you develop high fevers or worsening fatigue over the next several days.    

## 2022-05-03 NOTE — ED Provider Notes (Signed)
[facility]    CSN: [id number] Arrival date & time: 05/03/22  1557      History   Chief Complaint Chief Complaint  Patient presents with   Fever   Nasal Congestion   Cough    HPI Regina Schneider is a 47 y.o. female presenting for low-grade fever, fatigue, cough, congestion, sore throat, sneezing, headaches x 3 days.  Patient says symptoms came on suddenly and seem to be getting worse quickly.  She reports that it feels like she has congestion in her chest.  She reports occasional shortness of breath.  Reports that her partner recently started to have the same shortness symptoms.  She believes she may have a viral URI but is not sure and wants to be checked.  She also like to rule out strep.  She has amoxicillin at home and wants to know if she should take that.  Has been taking decongestants without relief.  No other complaints.  HPI  Past Medical History:  Diagnosis Date   Anxiety    Kidney stones    Kidney stones    Sleep apnea     Patient Active Problem List   Diagnosis Date Noted   Mixed hyperlipidemia 06/05/2021   Prediabetes 08/22/2020   Sleep apnea 07/01/2019   GAD (generalized anxiety disorder) 02/24/2019   Class 2 obesity due to excess calories with body mass index (BMI) of 35.0 to 35.9 in adult 02/24/2019    Past Surgical History:  Procedure Laterality Date   CERVICAL ABLATION     CESAREAN SECTION     2   CHOLECYSTECTOMY     FOOT SURGERY Right    metatarsal surygery   KIDNEY STONE SURGERY     TUBAL LIGATION Bilateral 04/29/2012    OB History     Gravida  2   Para  2   Term  2   Preterm      AB      Living  2      SAB      IAB      Ectopic      Multiple      Live Births               Home Medications    Prior to Admission medications   Medication Sig Start Date End Date Taking? Authorizing Provider  acetaminophen-codeine (TYLENOL #3) 300-30 MG tablet Take 1 tablet every 6 hours by oral route. 06/22/19    [provider]  atorvastatin (LIPITOR) 10 MG tablet Take 1 tablet (10 mg total) by mouth daily. 02/26/22   [name], NP  cyclobenzaprine (FLEXERIL) 10 MG tablet     [provider]  escitalopram (LEXAPRO) 10 MG tablet Take 0.5 tablets (5 mg total) by mouth daily. 02/23/22   [name], NP  ibuprofen (ADVIL) 200 MG tablet Take by mouth.    [provider]  traMADol (ULTRAM) 50 MG tablet Take 1 tablet every 6 hours by oral route. 06/19/19   [provider]  valACYclovir (VALTREX) 1000 MG tablet To take BID x 7 days as needed 06/05/21   [name], NP    Family History Family History  Problem Relation Age of Onset   COPD Mother    Dementia Mother    Cancer Father        lung and brain stem   COPD Father    Hypertension Sister    Breast cancer Maternal Grandmother  breast cancer, 50   Lung cancer Maternal Grandmother    Diabetes Paternal Grandmother     Social History Social History   Tobacco Use   Smoking status: Former    Types: Cigarettes    Quit date: 12/02/2018    Years since quitting: 3.4   Smokeless tobacco: Never   Tobacco comments:    3-4 cigarettes a day  Vaping Use   Vaping Use: Never used  Substance Use Topics   Alcohol use: Yes    Alcohol/week: 1.0 standard drink of alcohol    Types: 1 Glasses of wine per week    Comment: twice a week   Drug use: Never     Allergies   Ciprofloxacin and Sulfa antibiotics   Review of Systems Review of Systems  Constitutional:  Positive for fatigue. Negative for chills, diaphoresis and fever.  HENT:  Positive for congestion, rhinorrhea and sore throat. Negative for ear pain, sinus pressure and sinus pain.   Respiratory:  Positive for cough and shortness of breath.   Cardiovascular:  Negative for chest pain.  Gastrointestinal:  Negative for abdominal pain, nausea and vomiting.  Musculoskeletal:  Negative for arthralgias and myalgias.  Skin:  Negative for rash.   Neurological:  Positive for headaches. Negative for weakness.  Hematological:  Negative for adenopathy.     Physical Exam Triage Vital Signs ED Triage Vitals  Enc Vitals Group     BP      Pulse      Resp      Temp      Temp src      SpO2      Weight      Height      Head Circumference      Peak Flow      Pain Score      Pain Loc      Pain Edu?      Excl. in GC?    No data found.  Updated Vital Signs BP 110/74 (BP Location: Right Arm)   Pulse 100   Temp 98.7 F (37.1 C) (Oral)   Resp 16   SpO2 95%    Physical Exam Vitals and nursing note reviewed.  Constitutional:      General: She is not in acute distress.    Appearance: Normal appearance. She is not ill-appearing or toxic-appearing.  HENT:     Head: Normocephalic and atraumatic.     Right Ear: Tympanic membrane, ear canal and external ear normal.     Left Ear: Tympanic membrane, ear canal and external ear normal.     Nose: Congestion present.     Mouth/Throat:     Mouth: Mucous membranes are moist.     Pharynx: Oropharynx is clear. Posterior oropharyngeal erythema present.  Eyes:     General: No scleral icterus.       Right eye: No discharge.        Left eye: No discharge.     Conjunctiva/sclera: Conjunctivae normal.  Cardiovascular:     Rate and Rhythm: Normal rate and regular rhythm.     Heart sounds: Normal heart sounds.  Pulmonary:     Effort: Pulmonary effort is normal. No respiratory distress.     Breath sounds: Normal breath sounds. No wheezing, rhonchi or rales.  Musculoskeletal:     Cervical back: Neck supple.  Skin:    General: Skin is dry.  Neurological:     General: No focal deficit present.     Mental Status: She  is alert. Mental status is at baseline.     Motor: No weakness.     Gait: Gait normal.  Psychiatric:        Mood and Affect: Mood normal.        Behavior: Behavior normal.        Thought Content: Thought content normal.      UC Treatments / Results  Labs (all labs  ordered are listed, but only abnormal results are displayed) Labs Reviewed  GROUP A STREP BY PCR  SARS CORONAVIRUS 2 BY RT PCR    EKG   Radiology No results found.  Procedures Procedures (including critical care time)  Medications Ordered in UC Medications - No data to display  Initial Impression / Assessment and Plan / UC Course  I have reviewed the triage vital signs and the nursing notes.  Pertinent labs & imaging results that were available during my care of the patient were reviewed by me and considered in my medical decision making (see chart for details).   47 year old female presents for low-grade fever, fatigue, cough, congestion, sore throat, headaches x 3 days.  Also reports chest congestion and shortness of breath.  Vitals normal and stable.  Patient overall well-appearing.  No acute distress.  On exam has nasal congestion and erythema posterior pharynx.  Chest clear auscultation.  Respiratory panel and PCR strep test performed. All negative.  Discussed all results with patient.  Viral illness suspected.  Supportive care encouraged with increased rest and fluids.  Discussed with patient that I do not believe she needs oxygen at this time but if she does decide to take it she should wait at least 1 week.  Reviewed return and ER precautions.   Final Clinical Impressions(s) / UC Diagnoses   Final diagnoses:  Viral upper respiratory tract infection  Acute cough  Nasal congestion  Sore throat     Discharge Instructions      URI/COLD SYMPTOMS: Your exam today is consistent with a viral illness. Antibiotics are not indicated at this time. Use medications as directed, including cough syrup, nasal saline, and decongestants. Your symptoms should improve over the next few days and resolve within 7-10 days. Increase rest and fluids. F/u if symptoms worsen or predominate such as sore throat, ear pain, productive cough, shortness of breath, or if you develop high fevers  or worsening fatigue over the next several days.       ED Prescriptions   None    PDMP not reviewed this encounter.   Shirlee Latch, PA-C 05/03/22 1720

## 2022-05-21 DIAGNOSIS — Z720 Tobacco use: Secondary | ICD-10-CM | POA: Diagnosis not present

## 2022-06-09 ENCOUNTER — Other Ambulatory Visit: Payer: Self-pay | Admitting: Internal Medicine

## 2022-06-11 NOTE — Telephone Encounter (Signed)
Rx was sent to pharmacy on 02/23/22 #45/1. Pt has enough medication until FU 08/24.  Requested Prescriptions  Refused Prescriptions Disp Refills   escitalopram (LEXAPRO) 10 MG tablet [Pharmacy Med Name: ESCITALOPRAM 10MG  TABLETS] 45 tablet 1    Sig: TAKE 1/2 TABLET(5 MG) BY MOUTH DAILY     Psychiatry:  Antidepressants - SSRI Passed - 06/09/2022  8:00 AM      Passed - Valid encounter within last 6 months    Recent Outpatient Visits           3 months ago Encounter for general adult medical examination with abnormal findings   [place] [place], Salvadore Oxford, NP   8 months ago History of kidney stones   [facility] [place], Salvadore Oxford, NP   1 year ago Prediabetes   [facility] [place], Salvadore Oxford, NP   1 year ago GAD (generalized anxiety disorder)   [place] [facility] [place], Salvadore Oxford, NP   1 year ago History of kidney stones   [facility] [place], Salvadore Oxford, [place]

## 2022-07-16 DIAGNOSIS — F411 Generalized anxiety disorder: Secondary | ICD-10-CM | POA: Diagnosis not present

## 2022-07-16 DIAGNOSIS — F4389 Other reactions to severe stress: Secondary | ICD-10-CM | POA: Diagnosis not present

## 2022-07-16 DIAGNOSIS — F3289 Other specified depressive episodes: Secondary | ICD-10-CM | POA: Diagnosis not present

## 2022-08-17 DIAGNOSIS — F411 Generalized anxiety disorder: Secondary | ICD-10-CM | POA: Diagnosis not present

## 2022-08-17 DIAGNOSIS — F3289 Other specified depressive episodes: Secondary | ICD-10-CM | POA: Diagnosis not present

## 2022-09-06 DIAGNOSIS — F411 Generalized anxiety disorder: Secondary | ICD-10-CM | POA: Diagnosis not present

## 2022-09-06 DIAGNOSIS — F3289 Other specified depressive episodes: Secondary | ICD-10-CM | POA: Diagnosis not present

## 2022-09-27 ENCOUNTER — Encounter: Payer: Self-pay | Admitting: Internal Medicine

## 2022-09-27 MED ORDER — ESCITALOPRAM OXALATE 10 MG PO TABS: 5.0000 mg | ORAL_TABLET | Freq: Every day | ORAL | 0 refills | Status: DC

## 2022-10-25 ENCOUNTER — Ambulatory Visit: Payer: Self-pay

## 2022-10-25 ENCOUNTER — Emergency Department
Admission: EM | Admit: 2022-10-25 | Discharge: 2022-10-25 | Discharge status: Against Medical Advice | Payer: BC Managed Care – PPO | Source: Home / Self Care

## 2022-11-01 ENCOUNTER — Ambulatory Visit (INDEPENDENT_AMBULATORY_CARE_PROVIDER_SITE_OTHER): Payer: BC Managed Care – PPO | Admitting: Urology

## 2022-11-01 VITALS — BP 123/84 | HR 82 | Ht 65.0 in | Wt 201.1 lb

## 2022-11-01 DIAGNOSIS — R1032 Left lower quadrant pain: Secondary | ICD-10-CM

## 2022-11-01 DIAGNOSIS — Z87442 Personal history of urinary calculi: Secondary | ICD-10-CM

## 2022-11-01 DIAGNOSIS — R3915 Urgency of urination: Secondary | ICD-10-CM | POA: Diagnosis not present

## 2022-11-01 DIAGNOSIS — R109 Unspecified abdominal pain: Secondary | ICD-10-CM

## 2022-11-01 LAB — URINALYSIS, COMPLETE
Bilirubin, UA: NEGATIVE
Glucose, UA: NEGATIVE
Ketones, UA: NEGATIVE
Nitrite, UA: NEGATIVE
Protein,UA: NEGATIVE
RBC, UA: NEGATIVE
Specific Gravity, UA: 1.02 (ref 1.005–1.030)
Urobilinogen, Ur: 0.2 mg/dL (ref 0.2–1.0)
pH, UA: 7 (ref 5.0–7.5)

## 2022-11-01 LAB — MICROSCOPIC EXAMINATION: Epithelial Cells (non renal): >10 /[HPF] — AB (ref 0–10)

## 2022-11-01 LAB — BLADDER SCAN AMB NON-IMAGING: Scan Result: 36

## 2022-11-01 NOTE — Progress Notes (Signed)
Regina Schneider,acting as a Neurosurgeon for Regina Scotland, MD.,have documented all relevant documentation on the behalf of Regina Scotland, MD,as directed by  Regina Scotland, MD while in the presence of Regina Scotland, MD.  11/01/2022 12:19 PM   Regina Schneider 03-29-1975 [id number]  Referring provider: [name], NP [address] [place],  [place] [address]  Chief Complaint  Patient presents with   Establish Care   Flank Pain    HPI: 47 year-old female who presents today to establish care.   She has a personal history of kidney stones. She brought records with her from 2019 where she had a left-sided kidney stone that was treated with shockwave lithotripsy. It was in a diverticulum. She also had a known left lower pole stone as well. This was completed in [place]. Follow up [facility] showed unchanged kidney stones. We have no additional information.   She did see Dr. Lonna Cobb back in 2021 and had an x-ray that showed punctate left side stone burden. He ordered a CT stone protocol but it was never completed and there was no follow up.   Her urinalysis today shows 6-10 WBC, >10 epithelial cells. No blood. Consistent with a contaminated sample. No concern for infection.   She is worried that she has blood in her urine.   She experiences episodes of flank pain, described as stabbing on the left side of the back, which sometimes affects the stomach and causes pressure in the bowels. These episodes occur every couple of months and last for one to two days. She has a history of recurrent urinary tract infections and was last treated for a UTI approximately six months ago with antibiotics, which resolved the symptoms. She expresses concern about the possibility of bladder cancer due to a family history of cancer and a history of smoking.   She reports smoking more than ever and is considering smoking cessation. She has a history of tubal ligation and cervical ablation, and has had the gallbladder  removed.  Results for orders placed or performed in visit on 11/01/22  Microscopic Examination   Urine  Result Value Ref Range   WBC, UA 6-10 (A) 0 - 5 /hpf   RBC, Urine 0-2 0 - 2 /hpf   Epithelial Cells (non renal) >10 (A) 0 - 10 /hpf   Bacteria, UA Many (A) None seen/Few  Urinalysis, Complete  Result Value Ref Range   Specific Gravity, UA 1.020 1.005 - 1.030   pH, UA 7.0 5.0 - 7.5   Color, UA Yellow Yellow   Appearance Ur Hazy (A) Clear   Leukocytes,UA Trace (A) Negative   Protein,UA Negative Negative/Trace   Glucose, UA Negative Negative   Ketones, UA Negative Negative   RBC, UA Negative Negative   Bilirubin, UA Negative Negative   Urobilinogen, Ur 0.2 0.2 - 1.0 mg/dL   Nitrite, UA Negative Negative   Microscopic Examination See below:   Bladder Scan (Post Void Residual) in office  Result Value Ref Range   Scan Result 36 ml     PMH: Past Medical History:  Diagnosis Date   Anxiety    Kidney stones    Kidney stones    Sleep apnea     Surgical History: Past Surgical History:  Procedure Laterality Date   CERVICAL ABLATION     CESAREAN SECTION     2   CHOLECYSTECTOMY     FOOT SURGERY Right    metatarsal surygery   KIDNEY STONE SURGERY  TUBAL LIGATION Bilateral 04/29/2012    Home Medications:  Allergies as of 11/01/2022       Reactions   Ciprofloxacin    Hot flashes and abd pain   Sulfa Antibiotics Hives        Medication List        Accurate as of November 01, 2022 12:19 PM. If you have any questions, ask your nurse or doctor.          STOP taking these medications    atorvastatin 10 MG tablet Commonly known as: LIPITOR Stopped by: Regina Schneider       TAKE these medications    acetaminophen-codeine 300-30 MG tablet Commonly known as: TYLENOL #3 Take 1 tablet every 6 hours by oral route.   cyclobenzaprine 10 MG tablet Commonly known as: FLEXERIL   escitalopram 10 MG tablet Commonly known as: Lexapro Take 0.5 tablets (5 mg  total) by mouth daily. Please schedule an office visit before anymore refills.   ibuprofen 200 MG tablet Commonly known as: ADVIL Take by mouth.   traMADol 50 MG tablet Commonly known as: ULTRAM Take 1 tablet every 6 hours by oral route.   valACYclovir 1000 MG tablet Commonly known as: VALTREX To take BID x 7 days as needed        Allergies:  Allergies  Allergen Reactions   Ciprofloxacin     Hot flashes and abd pain   Sulfa Antibiotics Hives    Family History: Family History  Problem Relation Age of Onset   COPD Mother    Dementia Mother    Cancer Father        lung and brain stem   COPD Father    Hypertension Sister    Breast cancer Maternal Grandmother        breast cancer, 50   Lung cancer Maternal Grandmother    Diabetes Paternal Grandmother     Social History:  reports that she quit smoking about 3 years ago. Her smoking use included cigarettes. She has never used smokeless tobacco. She reports current alcohol use of about 1.0 standard drink of alcohol per week. She reports that she does not use drugs.   Physical Exam: BP 123/84   Pulse 82   Ht 5\' 5"  (1.651 m)   Wt 201 lb 2 oz (91.2 kg)   BMI 33.47 kg/m   Constitutional:  Alert and oriented, No acute distress. HEENT: [place] AT, moist mucus membranes.  Trachea midline, no masses. Neurologic: Grossly intact, no focal deficits, moving all 4 extremities. Psychiatric: Normal mood and affect.   Assessment & Plan:    1. Flank pain/ history of Kidney stones - History of kidney stones, previously treated with lithotripsy, with ongoing flank pain suggestive of stone activity.  - CT scan of the abdomen and pelvis to evaluate the kidney stones  2. Urinary urgency - History of urinary tract infections, with the last episode treated six months ago. Current urinalysis shows no infection or blood. - Reassess urinary symptoms and perform urinalysis at the follow-up visit to monitor for hematuria or infection.  3.  Smoking history - Current smoker with concerns about bladder cancer.  - Discussed the increased risk of bladder cancer associated with smoking and the importance of smoking cessation. - Encouraged smoking cessation and provide resources for support.   4. Possible microscopic hematuria - She has been told in the past that she has blood in her urine. We do not have documentation of this. It is unclear whether this  is on microscopic or dip.   Return in about 1 month (around 12/01/2022) for review of CT results, cystoscopy, and reassess urinary symptoms. .  I have reviewed the above documentation for accuracy and completeness, and I agree with the above.   Regina Scotland, MD   [facility] [address], [address] [place], [place] [address] [phone]

## 2022-11-15 ENCOUNTER — Ambulatory Visit
Admission: RE | Admit: 2022-11-15 | Discharge: 2022-11-15 | Disposition: A | Discharge status: Home / Self Care | Payer: BC Managed Care – PPO | Source: Ambulatory Visit | Attending: Urology | Admitting: Urology

## 2022-11-15 DIAGNOSIS — R109 Unspecified abdominal pain: Secondary | ICD-10-CM | POA: Diagnosis not present

## 2022-11-15 DIAGNOSIS — Z9049 Acquired absence of other specified parts of digestive tract: Secondary | ICD-10-CM | POA: Diagnosis not present

## 2022-11-15 DIAGNOSIS — Z87442 Personal history of urinary calculi: Secondary | ICD-10-CM | POA: Diagnosis not present

## 2022-11-15 DIAGNOSIS — R1032 Left lower quadrant pain: Secondary | ICD-10-CM | POA: Diagnosis not present

## 2022-11-15 DIAGNOSIS — R3915 Urgency of urination: Secondary | ICD-10-CM | POA: Diagnosis not present

## 2022-11-15 DIAGNOSIS — N200 Calculus of kidney: Secondary | ICD-10-CM | POA: Diagnosis not present

## 2022-11-19 ENCOUNTER — Other Ambulatory Visit: Payer: Self-pay | Admitting: Internal Medicine

## 2022-11-20 NOTE — Telephone Encounter (Signed)
Requested medication (s) are due for refill today: yes  Requested medication (s) are on the active medication list: yes  Last refill:  09/27/22 #15/0  Future visit scheduled: no  Notes to clinic:  pt due for OV, left VM to return the call to the office to scheduled an appt for medication refill request.       Requested Prescriptions  Pending Prescriptions Disp Refills   escitalopram (LEXAPRO) 10 MG tablet [Pharmacy Med Name: ESCITALOPRAM 10MG  TABLETS] 15 tablet 0    Sig: TAKE 1/2 TABLET(5 MG) BY MOUTH DAILY     Psychiatry:  Antidepressants - SSRI Failed - 11/19/2022  9:08 AM      Failed - Valid encounter within last 6 months    Recent Outpatient Visits           9 months ago Encounter for general adult medical examination with abnormal findings   [place] [facility] [place], Salvadore Oxford, NP   1 year ago History of kidney stones   [facility] [place], Salvadore Oxford, NP   1 year ago Prediabetes   [facility] [place], Salvadore Oxford, NP   2 years ago GAD (generalized anxiety disorder)   [facility] [place], Salvadore Oxford, NP   2 years ago History of kidney stones    [facility] [place], Salvadore Oxford, NP       Future Appointments             In 2 weeks Vanna Scotland, MD [facility]

## 2022-12-04 ENCOUNTER — Encounter: Payer: Self-pay | Admitting: Urology

## 2022-12-04 ENCOUNTER — Ambulatory Visit (INDEPENDENT_AMBULATORY_CARE_PROVIDER_SITE_OTHER): Payer: BC Managed Care – PPO | Admitting: Urology

## 2022-12-04 VITALS — BP 120/82 | HR 88

## 2022-12-04 DIAGNOSIS — Z87898 Personal history of other specified conditions: Secondary | ICD-10-CM | POA: Diagnosis not present

## 2022-12-04 DIAGNOSIS — R3915 Urgency of urination: Secondary | ICD-10-CM

## 2022-12-04 DIAGNOSIS — R109 Unspecified abdominal pain: Secondary | ICD-10-CM

## 2022-12-04 DIAGNOSIS — Z87442 Personal history of urinary calculi: Secondary | ICD-10-CM

## 2022-12-04 NOTE — Progress Notes (Signed)
   12/04/22  CC:  Chief Complaint  Patient presents with   Cysto    HPI: 47 year old female who presents today for follow-up.  Notably, she has presented of kidney stones.  Initial presentation, she was worried about bladder cancer and gross hematuria.  She was also having some intermittent flank pain which is since resolved.  She underwent a CT stone protocol that showed 11 mm right upper pole nonobstructing stone with overlying cortical scarring which felt to be likely embedded within a calyceal diverticulum versus within the cyst as well as some additional punctate bilateral kidney stones.  No obstruction or ureteral calculi appreciated.  No other GU pathology.  Overall, she is doing well today.  She has no urinary complaints other than urinary urgency.  Her flank pain is completely resolved.  She was unable to provide a urine specimen today.    Through shared decision-making, we elected to proceed with cystoscopy in light of her concern for an episode of gross hematuria as well as ongoing smoking.  Blood pressure 120/82, pulse 88. NED. A&Ox3.   No respiratory distress   Abd soft, NT, ND Normal external genitalia with patent urethral meatus  Cystoscopy Procedure Note  Patient identification was confirmed, informed consent was obtained, and patient was prepped using Betadine solution.  Lidocaine jelly was administered per urethral meatus.    Procedure: - Flexible cystoscope introduced, without any difficulty.   - Thorough search of the bladder revealed:    normal urethral meatus    normal urothelium    no stones    no ulcers     no tumors    no urethral polyps    no trabeculation  - Ureteral orifices were normal in position and appearance.  Post-Procedure: - Patient tolerated the procedure well  Assessment/ Plan:  1.  Possible gross hematuria versus microscopic hematuria/smoking history-cystoscopy today is unremarkable.  She has no documented true microscopic  hematuria but may have had an episode of gross hematuria.  CT stone protocol shows stable bilateral stone burden, likely not a contributing factor.  Will hold off on additional upper tract imaging at this time given her questionable history of this.  She was reassured by her cystoscopy today.  2.  Kidney stones-stable, no longer symptomatic.  Would recommend observation, based on her history it appears that these are unchanged.    3. Urinary urgency-occasionally bother her but not interested in pharmacotherapy.  She will return if her symptoms worsen.   F/u prn  Vanna Scotland, MD

## 2022-12-10 ENCOUNTER — Encounter: Payer: BC Managed Care – PPO | Admitting: Urology

## 2023-01-29 ENCOUNTER — Encounter: Payer: Self-pay | Admitting: Internal Medicine

## 2023-01-31 ENCOUNTER — Encounter: Payer: Self-pay | Admitting: Internal Medicine

## 2023-01-31 ENCOUNTER — Telehealth: Payer: BC Managed Care – PPO | Admitting: Internal Medicine

## 2023-01-31 DIAGNOSIS — F419 Anxiety disorder, unspecified: Secondary | ICD-10-CM

## 2023-01-31 DIAGNOSIS — F411 Generalized anxiety disorder: Secondary | ICD-10-CM

## 2023-01-31 MED ORDER — ALPRAZOLAM 0.5 MG PO TABS: 0.5000 mg | ORAL_TABLET | Freq: Every day | ORAL | 0 refills | Status: DC | PRN

## 2023-01-31 NOTE — Assessment & Plan Note (Signed)
Situational We will give limited supply of Xanax 0.5 mg daily as needed, avoid daily use Discussed addiction potential No need to restart escitalopram at this time Support offered Will monitor

## 2023-01-31 NOTE — Progress Notes (Addendum)
Virtual Visit via Video Note  I connected with Regina Schneider on 01/31/23 at 11:40 AM EST by a video enabled telemedicine application and verified that I am speaking with the correct person using two identifiers.  Location: Patient: Home Provider: Office  Person's participating in this video call: Nicki Reaper, NP-C and Haily Caley   I discussed the limitations of evaluation and management by telemedicine and the availability of in person appointments. The patient expressed understanding and agreed to proceed.  History of Present Illness:  Patient wanting to follow-up anxiety.  She has been under a lot of stress lately.  She reports her mother recently had to be hospitalized and undergo emergency heart surgery.  She is having to place her in rehab.  She also reports a a lot of situational stress at home, dealing with teenagers etc.  She finds herself feeling short of breath at time almost like a panic attack but does not feel like she has a full-blown panic attack.  She had been on Lexapro up until about 3 months ago when she weaned herself off.  She has been on it for 18 months prior and did feel like it worked well at that time however if she feels like her anxiety is more situational now and she does not need a daily medication.  She is not currently seeing a therapist.  She denies depression, SI/HI.  Past Medical History:  Diagnosis Date   Anxiety    Kidney stones    Kidney stones    Sleep apnea     Current Outpatient Medications  Medication Sig Dispense Refill   acetaminophen-codeine (TYLENOL #3) 300-30 MG tablet Take 1 tablet every 6 hours by oral route.     cyclobenzaprine (FLEXERIL) 10 MG tablet      ibuprofen (ADVIL) 200 MG tablet Take by mouth.     traMADol (ULTRAM) 50 MG tablet Take 1 tablet every 6 hours by oral route.     valACYclovir (VALTREX) 1000 MG tablet To take BID x 7 days as needed 15 tablet 0   No current facility-administered medications for this visit.     Allergies  Allergen Reactions   Ciprofloxacin     Hot flashes and abd pain   Sulfa Antibiotics Hives    Family History  Problem Relation Age of Onset   COPD Mother    Dementia Mother    Cancer Father        lung and brain stem   COPD Father    Hypertension Sister    Breast cancer Maternal Grandmother        breast cancer, 50   Lung cancer Maternal Grandmother    Diabetes Paternal Grandmother     Social History   Socioeconomic History   Marital status: Married    Spouse name: Not on file   Number of children: Not on file   Years of education: Not on file   Highest education level: Not on file  Occupational History   Not on file  Tobacco Use   Smoking status: Former    Current packs/day: 0.00    Types: Cigarettes    Quit date: 12/02/2018    Years since quitting: 4.1   Smokeless tobacco: Never   Tobacco comments:    3-4 cigarettes a day  Vaping Use   Vaping status: Never Used  Substance and Sexual Activity   Alcohol use: Yes    Alcohol/week: 1.0 standard drink of alcohol    Types: 1 Glasses of wine  per week    Comment: twice a week   Drug use: Never   Sexual activity: Yes    Birth control/protection: None  Other Topics Concern   Not on file  Social History Narrative   Not on file   Social Drivers of Health   Financial Resource Strain: Not on file  Food Insecurity: Not on file  Transportation Needs: Not on file  Physical Activity: Not on file  Stress: Not on file  Social Connections: Not on file  Intimate Partner Violence: Not on file     Constitutional: Denies fever, malaise, fatigue, headache or abrupt weight changes.  HEENT: Denies eye pain, eye redness, ear pain, ringing in the ears, wax buildup, runny nose, nasal congestion, bloody nose, or sore throat. Respiratory: Denies difficulty breathing, shortness of breath, cough or sputum production.   Cardiovascular: Denies chest pain, chest tightness, palpitations or swelling in the hands or feet.   Gastrointestinal: Denies abdominal pain, bloating, constipation, diarrhea or blood in the stool.  GU: Denies urgency, frequency, pain with urination, burning sensation, blood in urine, odor or discharge. Musculoskeletal: Denies decrease in range of motion, difficulty with gait, muscle pain or joint pain and swelling.  Skin: Denies redness, rashes, lesions or ulcercations.  Neurological: Denies dizziness, difficulty with memory, difficulty with speech or problems with balance and coordination.  Psych: Patient has a history of anxiety.  Denies depression, SI/HI.  No other specific complaints in a complete review of systems (except as listed in HPI above).  Observations/Objective:   Wt Readings from Last 3 Encounters:  11/01/22 201 lb 2 oz (91.2 kg)  02/23/22 214 lb (97.1 kg)  10/11/21 207 lb (93.9 kg)    General: Appears her stated age,  in NAD. Pulmonary/Chest: Normal effort. No respiratory distress. No wheezes, rales or ronchi noted.  Neurological: Alert and oriented. Psychiatric: Mood and affect normal. Behavior is normal. Judgment and thought content normal.     BMET    Component Value Date/Time   NA 139 02/23/2022 0934   K 4.3 02/23/2022 0934   CL 104 02/23/2022 0934   CO2 27 02/23/2022 0934   GLUCOSE 89 02/23/2022 0934   BUN 12 02/23/2022 0934   CREATININE 0.56 02/23/2022 0934   CALCIUM 9.3 02/23/2022 0934    Lipid Panel     Component Value Date/Time   CHOL 234 (H) 02/23/2022 0934   TRIG 237 (H) 02/23/2022 0934   HDL 49 (L) 02/23/2022 0934   CHOLHDL 4.8 02/23/2022 0934   LDLCALC 147 (H) 02/23/2022 0934    CBC    Component Value Date/Time   WBC 7.5 02/23/2022 0934   RBC 4.46 02/23/2022 0934   HGB 13.1 02/23/2022 0934   HCT 39.4 02/23/2022 0934   PLT 291 02/23/2022 0934   MCV 88.3 02/23/2022 0934   MCH 29.4 02/23/2022 0934   MCHC 33.2 02/23/2022 0934   RDW 12.7 02/23/2022 0934    Hgb A1C Lab Results  Component Value Date   HGBA1C 5.7 (H) 02/23/2022        Assessment and Plan:  RTC in 1 month for your annual exam  Follow Up Instructions:    I discussed the assessment and treatment plan with the patient. The patient was provided an opportunity to ask questions and all were answered. The patient agreed with the plan and demonstrated an understanding of the instructions.   The patient was advised to call back or seek an in-person evaluation if the symptoms worsen or if the condition fails  to improve as anticipated.   Nicki Reaper, NP

## 2023-01-31 NOTE — Patient Instructions (Signed)

## 2023-02-04 NOTE — Clinical Documentation Improvement (Signed)
addeneded

## 2023-02-05 ENCOUNTER — Encounter: Payer: Self-pay | Admitting: Internal Medicine

## 2023-03-13 ENCOUNTER — Encounter: Payer: Self-pay | Admitting: Internal Medicine

## 2023-03-14 MED ORDER — PHENTERMINE HCL 30 MG PO CAPS: 30.0000 mg | ORAL_CAPSULE | ORAL | 0 refills | Status: DC

## 2023-03-14 NOTE — Addendum Note (Signed)
 Addended by: [name] on: 03/14/2023 10:10 AM   Modules accepted: Orders

## 2023-03-27 ENCOUNTER — Encounter: Payer: Self-pay | Admitting: Internal Medicine

## 2023-03-28 ENCOUNTER — Ambulatory Visit: Admitting: Internal Medicine

## 2023-03-29 ENCOUNTER — Encounter: Payer: Self-pay | Admitting: Internal Medicine

## 2023-03-29 ENCOUNTER — Telehealth: Admitting: Internal Medicine

## 2023-03-29 DIAGNOSIS — Z636 Dependent relative needing care at home: Secondary | ICD-10-CM

## 2023-03-29 DIAGNOSIS — Z0289 Encounter for other administrative examinations: Secondary | ICD-10-CM | POA: Diagnosis not present

## 2023-03-29 NOTE — Progress Notes (Signed)
 Virtual Visit via Video Note  I connected with Regina Schneider on 03/29/23 at  1:40 PM EDT by a video enabled telemedicine application and verified that I am speaking with the correct person using two identifiers.  Location: Patient: Work Provider: Engineer, structural in this video call: Nicki Reaper, NP-C and Ivan Maskell   I discussed the limitations of evaluation and management by telemedicine and the availability of in person appointments. The patient expressed understanding and agreed to proceed.  History of Present Illness:  Patient requesting FMLA form completion.  Her mother has been diagnosed with dementia and she is requiring 24-hour care.  She needs assistance with all ADLs.  She is currently hospitalized and will be looking to be placed in a rehab facility hopefully transitioning to long-term care afterwards.  Past Medical History:  Diagnosis Date   Anxiety    Kidney stones    Kidney stones    Sleep apnea     Current Outpatient Medications  Medication Sig Dispense Refill   acetaminophen-codeine (TYLENOL #3) 300-30 MG tablet Take 1 tablet every 6 hours by oral route.     ALPRAZolam (XANAX) 0.5 MG tablet Take 1 tablet (0.5 mg total) by mouth daily as needed for anxiety. 10 tablet 0   cyclobenzaprine (FLEXERIL) 10 MG tablet      ibuprofen (ADVIL) 200 MG tablet Take by mouth.     phentermine 30 MG capsule Take 1 capsule (30 mg total) by mouth every morning. 30 capsule 0   traMADol (ULTRAM) 50 MG tablet Take 1 tablet every 6 hours by oral route.     valACYclovir (VALTREX) 1000 MG tablet To take BID x 7 days as needed 15 tablet 0   No current facility-administered medications for this visit.    Allergies  Allergen Reactions   Ciprofloxacin     Hot flashes and abd pain   Sulfa Antibiotics Hives    Family History  Problem Relation Age of Onset   COPD Mother    Dementia Mother    Cancer Father        lung and brain stem   COPD Father    Hypertension  Sister    Breast cancer Maternal Grandmother        breast cancer, 50   Lung cancer Maternal Grandmother    Diabetes Paternal Grandmother     Social History   Socioeconomic History   Marital status: Married    Spouse name: Not on file   Number of children: Not on file   Years of education: Not on file   Highest education level: Not on file  Occupational History   Not on file  Tobacco Use   Smoking status: Former    Current packs/day: 0.00    Types: Cigarettes    Quit date: 12/02/2018    Years since quitting: 4.3   Smokeless tobacco: Never   Tobacco comments:    3-4 cigarettes a day  Vaping Use   Vaping status: Never Used  Substance and Sexual Activity   Alcohol use: Yes    Alcohol/week: 1.0 standard drink of alcohol    Types: 1 Glasses of wine per week    Comment: twice a week   Drug use: Never   Sexual activity: Yes    Birth control/protection: None  Other Topics Concern   Not on file  Social History Narrative   Not on file   Social Drivers of Health   Financial Resource Strain: Not on file  Food  Insecurity: Not on file  Transportation Needs: Not on file  Physical Activity: Not on file  Stress: Not on file  Social Connections: Not on file  Intimate Partner Violence: Not on file     Constitutional: Denies fever, malaise, fatigue, headache or abrupt weight changes.  HEENT: Denies eye pain, eye redness, ear pain, ringing in the ears, wax buildup, runny nose, nasal congestion, bloody nose, or sore throat. Respiratory: Denies difficulty breathing, shortness of breath, cough or sputum production.   Cardiovascular: Denies chest pain, chest tightness, palpitations or swelling in the hands or feet.  Gastrointestinal: Denies abdominal pain, bloating, constipation, diarrhea or blood in the stool.  GU: Denies urgency, frequency, pain with urination, burning sensation, blood in urine, odor or discharge. Musculoskeletal: Denies decrease in range of motion, difficulty with  gait, muscle pain or joint pain and swelling.  Skin: Denies redness, rashes, lesions or ulcercations.  Neurological: Denies dizziness, difficulty with memory, difficulty with speech or problems with balance and coordination.  Psych: Patient has a history of anxiety.  Denies depression, SI/HI.  No other specific complaints in a complete review of systems (except as listed in HPI above).  Observations/Objective:   Wt Readings from Last 3 Encounters:  11/01/22 201 lb 2 oz (91.2 kg)  02/23/22 214 lb (97.1 kg)  10/11/21 207 lb (93.9 kg)    General: Appears her stated age, well developed, well nourished in NAD. Pulmonary/Chest: Normal effort. No respiratory distress.  Neurological: Alert and oriented. Coordination normal.  Psychiatric: Mood and affect normal. Behavior is normal. Judgment and thought content normal.     BMET    Component Value Date/Time   NA 139 02/23/2022 0934   K 4.3 02/23/2022 0934   CL 104 02/23/2022 0934   CO2 27 02/23/2022 0934   GLUCOSE 89 02/23/2022 0934   BUN 12 02/23/2022 0934   CREATININE 0.56 02/23/2022 0934   CALCIUM 9.3 02/23/2022 0934    Lipid Panel     Component Value Date/Time   CHOL 234 (H) 02/23/2022 0934   TRIG 237 (H) 02/23/2022 0934   HDL 49 (L) 02/23/2022 0934   CHOLHDL 4.8 02/23/2022 0934   LDLCALC 147 (H) 02/23/2022 0934    CBC    Component Value Date/Time   WBC 7.5 02/23/2022 0934   RBC 4.46 02/23/2022 0934   HGB 13.1 02/23/2022 0934   HCT 39.4 02/23/2022 0934   PLT 291 02/23/2022 0934   MCV 88.3 02/23/2022 0934   MCH 29.4 02/23/2022 0934   MCHC 33.2 02/23/2022 0934   RDW 12.7 02/23/2022 0934    Hgb A1C Lab Results  Component Value Date   HGBA1C 5.7 (H) 02/23/2022       Assessment and Plan:  Caregiver Stress, Encounter for form completion with patient:  FMLA forms completed and will be faxed back  Schedule an appointment for your annual exam  Follow Up Instructions:    I discussed the assessment and  treatment plan with the patient. The patient was provided an opportunity to ask questions and all were answered. The patient agreed with the plan and demonstrated an understanding of the instructions.   The patient was advised to call back or seek an in-person evaluation if the symptoms worsen or if the condition fails to improve as anticipated.   Nicki Reaper, NP

## 2023-03-29 NOTE — Patient Instructions (Signed)
 Alzheimer's Disease Caregiver Guide Alzheimer's disease is a condition that makes a person: Forget things. Act differently. Have trouble paying attention and doing simple tasks. Have trouble talking and responding to your questions. These things get worse with time. The tips below can help you care for the person. How to help manage lifestyle changes Tips to help with symptoms Be calm and patient. Give simple, short answers to questions. Long answers can confuse the person. Avoid correcting the person in a negative way. Do not argue with the person. This may make the person more upset. Try not to take things personally, even if the person forgets your name. Tips to lessen frustration Use simple words and a calm voice. Only give one direction at a time. Do daily tasks, like bathing and dressing, when the person is at their best. Also make any appointments for these times. Take your time. Simple tasks may take longer. Allow plenty of time to get tasks done. Limit choices for the person. Too many choices can be stressful. Involve the person in what you're doing. Keep things organized: Keep a daily routine. Organize medicines in a pillbox for each day of the week. Keep a calendar in a central place. Use it to remind the person of health care visits or other activities. Avoid new or crowded places, if possible. Buy clothes and shoes for the person that are easy to put on and take off. Try to change the subject if the person becomes frustrated or angry. This is a great way to make things less tense. Tips to prevent injury  Keep floors clear. Remove rugs, magazine racks, and floor lamps. Keep hallways well-lit, especially at night. Put a handrail and nonslip mat in the bathtub or shower. Put childproof locks on cabinets that have dangerous items in them. These items include medicine, alcohol, guns, cleaning products, and sharp tools. Put locks on doors where the person can't see or reach  them. This helps keep the person from going out of the house and getting lost. Remove car keys and lock garage doors so the person doesn't try to drive. Be ready for emergencies. Keep a list of emergency phone numbers and addresses close by. Bracelets may be worn that track location and identify the person as having memory loss. This should be worn at all times for safety. Tips for the future  Discuss financial and legal planning early. Get help from a professional. People with this disease have trouble managing their money as the disease gets worse. Talk about advance directives, safety, and daily care. Take these steps: Create a living will and choose a power of attorney. This is someone who can make decisions for the person with Alzheimer's disease when they can no longer do so. Discuss driving safety and when to stop driving. The person's health care provider can help with this. If the person lives alone, make sure they're safe. Some people need extra help at home. Other people need more care at a nursing home or care center. How to recognize changes in the person's condition With this disease, memory problems and confusion slowly get worse. In time, the person may not know their friends and family members. The disease can cause changes in behavior and mood, such as anger or feeling worried or nervous. The person may also hallucinate. This means they see, hear, taste, smell, or feel things that aren't real. These changes can come on all of a sudden. They may happen in response to something such as: Pain.  An infection. Changes in temperature. Too much stimulation or noise. Feeling lost or scared. Medicines. Where to find support Find out about services that can provide short-term care for the person. This is called respite care. It can allow you to take a break when you need one. Join a support group near you. These groups can help you: Learn ways to manage stress. Share experiences with  others. Get emotional comfort and support. Learn about caregiving as the disease gets worse. Find community resources. Where to find more information Alzheimer's Association: [employer] Contact a health care provider if: The person has a fever. The person has a sudden change in behavior that doesn't get better when you try to help calm them. The person has a sudden increase in confusion or new hallucinations. The person is not able to take care of themselves at home. You are no longer able to care for the person. Get help right away if: You feel like the person may hurt themselves or others. The person has talked about taking their own life. These symptoms may be an emergency. Take one of these steps right away: Go to your nearest emergency room. Call 911. Call the National Suicide Prevention Lifeline at 4436283804 or 988. Text the Crisis Text Line at 339 077 5473. This information is not intended to replace advice given to you by your health care provider. Make sure you discuss any questions you have with your health care provider. Document Revised: 09/20/2022 Document Reviewed: 02/15/2022 Elsevier Patient Education  2024 [employer].

## 2023-04-12 ENCOUNTER — Encounter: Admitting: Internal Medicine

## 2023-04-26 ENCOUNTER — Encounter: Admitting: Internal Medicine

## 2023-04-26 NOTE — Progress Notes (Deleted)
 Subjective:    Patient ID: Regina Schneider, female    DOB: [birth year], 48 y.o.   MRN: [id number]  HPI  Patient presents to clinic today for annual exam.  Flu: 10/2018 Tetanus: 02/2018 COVID: [employer] x 1 Pap smear: 05/2019 Mammogram: 2019, scheduled 04/2022 Colon screening: 03/2022, Cologuard Vision screening: annually Dentist: biannually  Diet: She does eat meat. She consumes fruits and veggies. She tries to avoid fried foods. She drinks mostly coffee, water, unsweet tea or Dt. Soda. Exercise: Walking  Review of Systems     Past Medical History:  Diagnosis Date   Anxiety    Kidney stones    Kidney stones    Sleep apnea     Current Outpatient Medications  Medication Sig Dispense Refill   acetaminophen-codeine (TYLENOL #3) 300-30 MG tablet Take 1 tablet every 6 hours by oral route.     ALPRAZolam  (XANAX ) 0.5 MG tablet Take 1 tablet (0.5 mg total) by mouth daily as needed for anxiety. 10 tablet 0   cyclobenzaprine  (FLEXERIL ) 10 MG tablet      ibuprofen (ADVIL) 200 MG tablet Take by mouth.     phentermine  30 MG capsule Take 1 capsule (30 mg total) by mouth every morning. 30 capsule 0   traMADol (ULTRAM) 50 MG tablet Take 1 tablet every 6 hours by oral route.     valACYclovir  (VALTREX ) 1000 MG tablet To take BID x 7 days as needed 15 tablet 0   No current facility-administered medications for this visit.    Allergies  Allergen Reactions   Ciprofloxacin      Hot flashes and abd pain   Sulfa Antibiotics Hives    Family History  Problem Relation Age of Onset   COPD Mother    Dementia Mother    Cancer Father        lung and brain stem   COPD Father    Hypertension Sister    Breast cancer Maternal Grandmother        breast cancer, 50   Lung cancer Maternal Grandmother    Diabetes Paternal Grandmother     Social History   Socioeconomic History   Marital status: Married    Spouse name: Not on file   Number of children: Not on file   Years of education: Not on  file   Highest education level: Not on file  Occupational History   Not on file  Tobacco Use   Smoking status: Former    Current packs/day: 0.00    Types: Cigarettes    Quit date: 12/02/2018    Years since quitting: 4.4   Smokeless tobacco: Never   Tobacco comments:    3-4 cigarettes a day  Vaping Use   Vaping status: Never Used  Substance and Sexual Activity   Alcohol use: Yes    Alcohol/week: 1.0 standard drink of alcohol    Types: 1 Glasses of wine per week    Comment: twice a week   Drug use: Never   Sexual activity: Yes    Birth control/protection: None  Other Topics Concern   Not on file  Social History Narrative   Not on file   Social Drivers of Health   Financial Resource Strain: Not on file  Food Insecurity: Not on file  Transportation Needs: Not on file  Physical Activity: Not on file  Stress: Not on file  Social Connections: Not on file  Intimate Partner Violence: Not on file     Constitutional: Denies fever, malaise, fatigue, headache  or abrupt weight changes.  HEENT: Denies eye pain, eye redness, ear pain, ringing in the ears, wax buildup, runny nose, nasal congestion, bloody nose, or sore throat. Respiratory: Denies difficulty breathing, shortness of breath, cough or sputum production.   Cardiovascular: Denies chest pain, chest tightness, palpitations or swelling in the hands or feet.  Gastrointestinal: Denies abdominal pain, bloating, constipation, diarrhea or blood in the stool.  GU: Pt reports amenorrhea. Denies urgency, frequency, pain with urination, burning sensation, blood in urine, odor or discharge. Musculoskeletal: Denies decrease in range of motion, difficulty with gait, muscle pain or joint pain and swelling.  Skin: Denies redness, rashes, lesions or ulcercations.  Neurological: Denies dizziness, difficulty with memory, difficulty with speech or problems with balance and coordination.  Psych: Patient has a history of anxiety.  Denies  depression, SI/HI.  No other specific complaints in a complete review of systems (except as listed in HPI above).  Objective:   Physical Exam  There were no vitals taken for this visit.  Wt Readings from Last 3 Encounters:  11/01/22 201 lb 2 oz (91.2 kg)  02/23/22 214 lb (97.1 kg)  10/11/21 207 lb (93.9 kg)    General: Appears her stated age, obese, in NAD. Skin: Warm, dry and intact. No rashes, lesions or ulcerations noted. HEENT: Head: normal shape and size; Eyes: sclera white, no icterus, conjunctiva pink, PERRLA and EOMs intact;  Neck:  Neck supple, trachea midline. No masses, lumps or thyromegaly present.  Cardiovascular: Normal rate and rhythm. S1,S2 noted.  No murmur, rubs or gallops noted. No JVD or BLE edema.  Pulmonary/Chest: Normal effort and positive vesicular breath sounds. No respiratory distress. No wheezes, rales or ronchi noted.  Abdomen: Soft and nontender. Normal bowel sounds.  Musculoskeletal: Strength 5/5 BUE/BLE. No difficulty with gait.  Neurological: Alert and oriented. Cranial nerves II-XII grossly intact. Coordination normal.  Psychiatric: Mood and affect normal. Behavior is normal. Judgment and thought content normal.    BMET    Component Value Date/Time   NA 139 02/23/2022 0934   K 4.3 02/23/2022 0934   CL 104 02/23/2022 0934   CO2 27 02/23/2022 0934   GLUCOSE 89 02/23/2022 0934   BUN 12 02/23/2022 0934   CREATININE 0.56 02/23/2022 0934   CALCIUM  9.3 02/23/2022 0934    Lipid Panel     Component Value Date/Time   CHOL 234 (H) 02/23/2022 0934   TRIG 237 (H) 02/23/2022 0934   HDL 49 (L) 02/23/2022 0934   CHOLHDL 4.8 02/23/2022 0934   LDLCALC 147 (H) 02/23/2022 0934    CBC    Component Value Date/Time   WBC 7.5 02/23/2022 0934   RBC 4.46 02/23/2022 0934   HGB 13.1 02/23/2022 0934   HCT 39.4 02/23/2022 0934   PLT 291 02/23/2022 0934   MCV 88.3 02/23/2022 0934   MCH 29.4 02/23/2022 0934   MCHC 33.2 02/23/2022 0934   RDW 12.7  02/23/2022 0934    Hgb A1C Lab Results  Component Value Date   HGBA1C 5.7 (H) 02/23/2022           Assessment & Plan:   Preventative Health Maintenance:  Encouraged her to get a flu shot in the fall Tetanus UTD Encouraged her to get her COVID booster Pap smear UTD Mammogram ordered-she will call to schedule Colon screening UTD Encouraged her to consume a balanced diet and exercise regimen Advised her to see an eye doctor and dentist annually We will check CBC, c-Met, lipid, A1c today  RTC in  6 months, follow-up chronic conditions Helayne Lo, NP

## 2023-05-16 DIAGNOSIS — F17210 Nicotine dependence, cigarettes, uncomplicated: Secondary | ICD-10-CM | POA: Diagnosis not present

## 2023-07-23 ENCOUNTER — Ambulatory Visit (INDEPENDENT_AMBULATORY_CARE_PROVIDER_SITE_OTHER)

## 2023-07-23 ENCOUNTER — Ambulatory Visit
Admission: RE | Admit: 2023-07-23 | Discharge: 2023-07-23 | Disposition: A | Discharge status: Home / Self Care | Source: Ambulatory Visit | Attending: Emergency Medicine | Admitting: Emergency Medicine

## 2023-07-23 VITALS — BP 111/79 | HR 95 | Temp 99.2°F | Resp 16 | Ht 65.0 in | Wt 190.0 lb

## 2023-07-23 DIAGNOSIS — R0789 Other chest pain: Secondary | ICD-10-CM | POA: Diagnosis not present

## 2023-07-23 DIAGNOSIS — J069 Acute upper respiratory infection, unspecified: Secondary | ICD-10-CM

## 2023-07-23 DIAGNOSIS — R059 Cough, unspecified: Secondary | ICD-10-CM | POA: Diagnosis not present

## 2023-07-23 DIAGNOSIS — R0602 Shortness of breath: Secondary | ICD-10-CM | POA: Diagnosis not present

## 2023-07-23 DIAGNOSIS — R051 Acute cough: Secondary | ICD-10-CM

## 2023-07-23 DIAGNOSIS — Z87891 Personal history of nicotine dependence: Secondary | ICD-10-CM | POA: Diagnosis not present

## 2023-07-23 LAB — GROUP A STREP BY PCR: Group A Strep by PCR: NOT DETECTED

## 2023-07-23 LAB — SARS CORONAVIRUS 2 BY RT PCR: SARS Coronavirus 2 by RT PCR: NEGATIVE

## 2023-07-23 MED ORDER — FLUTICASONE PROPIONATE 50 MCG/ACT NA SUSP
2.0000 | Freq: Every day | NASAL | 0 refills | Status: DC
Start: 2023-07-23 — End: 2023-09-20

## 2023-07-23 MED ORDER — PROMETHAZINE-DM 6.25-15 MG/5ML PO SYRP: 5.0000 mL | ORAL_SOLUTION | Freq: Four times a day (QID) | ORAL | 0 refills | Status: DC | PRN

## 2023-07-23 MED ORDER — ALBUTEROL SULFATE HFA 108 (90 BASE) MCG/ACT IN AERS: 1.0000 | INHALATION_SPRAY | RESPIRATORY_TRACT | 0 refills | Status: DC | PRN

## 2023-07-23 MED ORDER — AEROCHAMBER MV MISC: 1 refills | Status: DC

## 2023-07-23 MED ORDER — PREDNISONE 20 MG PO TABS: 40.0000 mg | ORAL_TABLET | Freq: Every day | ORAL | 0 refills | Status: AC

## 2023-07-23 MED ORDER — IBUPROFEN 600 MG PO TABS: 600.0000 mg | ORAL_TABLET | Freq: Three times a day (TID) | ORAL | 0 refills | Status: AC | PRN
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
## 2023-07-23 NOTE — Discharge Instructions (Addendum)
 Chest x-ray normal.  COVID and strep negative.  Take two puffs from your albuterol  inhaler with your spacer every 4 to 6 hours as needed.  Alternatively, you can do 2 puffs every 4 hours for 2 days, then every 6 hours for 2 days, then as needed. You can back off if you start to improve  sooner. Finish the steroids unless your doctor tells you to stop. Take tylenol 1 gram combined with  600 mg of motrin  up to 3-4 times a day as needed for pain. Make sure you drink extra fluids. Return to the ER if you get worse, have a fever >100.4, or any other concerns.   Flonase , saline nasal irrigation with a NeilMed rinse and distilled water as often as you want for nasal congestion.  For your sore throat: Some people find salt water gargles and  Traditional Medicinal's Throat Coat tea helpful. Take 5 mL of liquid Benadryl and 5 mL of Maalox/Mylanta. Mix it together, and then hold it in your mouth for as long as you can and then swallow. You may do this 4 times a day.  Honey and lemon dissolved in hot water can also be soothing.  If the spacer is too expensive at the pharmacy, you can get an AeroChamber Z-Stat off of [employer] for about $10-$15.  Go to www.goodrx.com  or www.costplusdrugs.com to look up your medications. This will give you a list of where you can find your prescriptions at the most affordable prices. Or ask the pharmacist what the cash price is, or if they have any other discount programs available to help make your medication more affordable. This can be less expensive than what you would pay with insurance.
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
## 2023-09-18 ENCOUNTER — Encounter: Payer: Self-pay | Admitting: Internal Medicine

## 2023-09-20 ENCOUNTER — Ambulatory Visit: Payer: Self-pay

## 2023-09-20 ENCOUNTER — Ambulatory Visit: Admitting: Internal Medicine

## 2023-09-20 ENCOUNTER — Encounter: Payer: Self-pay | Admitting: Internal Medicine

## 2023-09-20 VITALS — BP 120/84 | Temp 98.5°F | Ht 65.0 in | Wt 209.6 lb

## 2023-09-20 DIAGNOSIS — K219 Gastro-esophageal reflux disease without esophagitis: Secondary | ICD-10-CM

## 2023-09-20 DIAGNOSIS — A6004 Herpesviral vulvovaginitis: Secondary | ICD-10-CM | POA: Diagnosis not present

## 2023-09-20 DIAGNOSIS — S025XXB Fracture of tooth (traumatic), initial encounter for open fracture: Secondary | ICD-10-CM | POA: Diagnosis not present

## 2023-09-20 DIAGNOSIS — S80212A Abrasion, left knee, initial encounter: Secondary | ICD-10-CM

## 2023-09-20 DIAGNOSIS — S80211A Abrasion, right knee, initial encounter: Secondary | ICD-10-CM

## 2023-09-20 DIAGNOSIS — K047 Periapical abscess without sinus: Secondary | ICD-10-CM

## 2023-09-20 MED ORDER — OMEPRAZOLE 20 MG PO CPDR
20.0000 mg | DELAYED_RELEASE_CAPSULE | Freq: Every day | ORAL | 0 refills | Status: AC
Start: 2023-09-20 — End: ?

## 2023-09-20 MED ORDER — VALACYCLOVIR HCL 1 G PO TABS
1000.0000 mg | ORAL_TABLET | Freq: Two times a day (BID) | ORAL | 0 refills | Status: AC
Start: 2023-09-20 — End: 2023-09-27

## 2023-09-20 MED ORDER — AMOXICILLIN 875 MG PO TABS
875.0000 mg | ORAL_TABLET | Freq: Two times a day (BID) | ORAL | 0 refills | Status: AC
Start: 2023-09-20 — End: 2023-09-30

## 2023-09-20 NOTE — Telephone Encounter (Signed)
Will discuss at upcoming appointment today 
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
## 2023-09-20 NOTE — Progress Notes (Signed)
 Subjective:    Patient ID: Regina Schneider, female    DOB: 03-Jul-1975, 48 y.o.   MRN: [id number]  HPI  Discussed the use of AI scribe software for clinical note transcription with the patient, who gave verbal consent to proceed.  Regina Schneider is a 48 year old female who presents with knee pain and multiple concerns following a fall.  She fell on her knees approximately five to six days ago, resulting in soreness and a slow-healing wound on one knee. The area is described as stinging and burning. She has not noticed any redness, warmth or drainage. She has been applying Neosporin and keeping the wound clean.  She has a dental issue with part of her crown broken off, leading to tenderness in the left lower jaw. This has been ongoing for a while, and she has not yet seen a dentist. Smoking on the affected side may aggravate the condition.  She reports a possible sore in the genital area, which she suspects might be related to a past herpes diagnosis from six to seven years ago. She has not experienced issues since the initial outbreak and has not used Valtrex , which was prescribed at that time. The sore initially caused pain but no longer does.  She experiences chest tightness and a sensation of heat in her chest when eating certain foods, which she describes as a digestive issue. No nausea, vomiting, diarrhea, constipation, or blood in her stool. She recalls a respiratory infection a few months ago, during which an EKG was performed, but her current symptoms feel different.       Review of Systems     Past Medical History:  Diagnosis Date   Anxiety    Kidney stones    Kidney stones    Sleep apnea     Current Outpatient Medications  Medication Sig Dispense Refill   albuterol  (VENTOLIN  HFA) 108 (90 Base) MCG/ACT inhaler Inhale 1-2 puffs into the lungs every 4 (four) hours as needed for wheezing or shortness of breath. 1 each 0   ALPRAZolam  (XANAX ) 0.5 MG tablet Take 1 tablet (0.5  mg total) by mouth daily as needed for anxiety. 10 tablet 0   fluticasone  (FLONASE ) 50 MCG/ACT nasal spray Place 2 sprays into both nostrils daily. 16 g 0   ibuprofen  (ADVIL ) 600 MG tablet Take 1 tablet (600 mg total) by mouth every 8 (eight) hours as needed. 30 tablet 0   phentermine  30 MG capsule Take 1 capsule (30 mg total) by mouth every morning. 30 capsule 0   promethazine -dextromethorphan (PROMETHAZINE -DM) 6.25-15 MG/5ML syrup Take 5 mLs by mouth 4 (four) times daily as needed for cough. 118 mL 0   Spacer/Aero-Holding Chambers (AEROCHAMBER MV) inhaler Use as instructed 1 each 1   valACYclovir  (VALTREX ) 1000 MG tablet To take BID x 7 days as needed 15 tablet 0   No current facility-administered medications for this visit.    Allergies  Allergen Reactions   Ciprofloxacin      Hot flashes and abd pain   Sulfa Antibiotics Hives    Family History  Problem Relation Age of Onset   COPD Mother    Dementia Mother    Cancer Father        lung and brain stem   COPD Father    Hypertension Sister    Breast cancer Maternal Grandmother        breast cancer, 50   Lung cancer Maternal Grandmother    Diabetes Paternal Grandmother  Social History   Socioeconomic History   Marital status: Married    Spouse name: Not on file   Number of children: Not on file   Years of education: Not on file   Highest education level: Not on file  Occupational History   Not on file  Tobacco Use   Smoking status: Former    Current packs/day: 0.00    Types: Cigarettes    Quit date: 12/02/2018    Years since quitting: 4.8   Smokeless tobacco: Never   Tobacco comments:    3-4 cigarettes a day  Vaping Use   Vaping status: Never Used  Substance and Sexual Activity   Alcohol use: Yes    Alcohol/week: 1.0 standard drink of alcohol    Types: 1 Glasses of wine per week    Comment: twice a week   Drug use: Never   Sexual activity: Yes    Birth control/protection: None  Other Topics Concern   Not  on file  Social History Narrative   Not on file   Social Drivers of Health   Financial Resource Strain: Not on file  Food Insecurity: Not on file  Transportation Needs: Not on file  Physical Activity: Not on file  Stress: Not on file  Social Connections: Not on file  Intimate Partner Violence: Not on file     Constitutional: Denies fever, malaise, fatigue, headache or abrupt weight changes.  HEENT: Pt reports left lower dental pain and gum swelling. Denies eye pain, eye redness, ear pain, ringing in the ears, wax buildup, runny nose, nasal congestion, bloody nose, or sore throat. Respiratory: Denies difficulty breathing, shortness of breath, cough or sputum production.   Cardiovascular: Pt reports chest tightness. Denies chest pain, palpitations or swelling in the hands or feet.  Gastrointestinal: Pt reports epigastric pain. Denies bloating, constipation, diarrhea or blood in the stool.  GU: Pt reports amenorrhea. Denies urgency, frequency, pain with urination, burning sensation, blood in urine, odor or discharge. Musculoskeletal: Denies decrease in range of motion, difficulty with gait, muscle pain or joint pain and swelling.  Skin: Patient reports wound to bilateral knees, vaginal lesion.  Denies rashes, lesions or ulcercations.  Neurological: Denies dizziness, difficulty with memory, difficulty with speech or problems with balance and coordination.  Psych: Patient has a history of anxiety.  Denies depression, SI/HI.  No other specific complaints in a complete review of systems (except as listed in HPI above).  Objective:   Physical Exam  BP 120/84 (BP Location: Right Arm, Patient Position: Sitting, Cuff Size: Normal)   Temp 98.5 F (36.9 C)   Ht 5' 5 (1.651 m)   Wt 209 lb 9.6 oz (95.1 kg)   BMI 34.88 kg/m    Wt Readings from Last 3 Encounters:  07/23/23 190 lb (86.2 kg)  11/01/22 201 lb 2 oz (91.2 kg)  02/23/22 214 lb (97.1 kg)    General: Appears her stated age,  obese, in NAD. Skin: Warm, dry and intact. Abrasions noted to bilateral knees without surrounding redness or signs of infection. 0.5 cm ulceration noted of right labia near the clitoris. HEENT: Head: normal shape and size; Eyes: sclera white, no icterus, conjunctiva pink, PERRLA and EOMs intact; Mouth: Broken crown on the left lower jaw with gum swelling, redness and pain with palpation. Neck:  No adenopathy noted. Cardiovascular: Normal rate and rhythm. S1,S2 noted.  No murmur, rubs or gallops noted. Pulmonary/Chest: Normal effort and positive vesicular breath sounds. No respiratory distress. No wheezes, rales or ronchi  noted.  Abdomen: Soft and tender in the epigastric region. Normal bowel sounds.  Musculoskeletal: Normal flexion and extension of the knees. No joint swelling noted. No difficulty with gait.  Neurological: Alert and oriented.   BMET    Component Value Date/Time   NA 139 02/23/2022 0934   K 4.3 02/23/2022 0934   CL 104 02/23/2022 0934   CO2 27 02/23/2022 0934   GLUCOSE 89 02/23/2022 0934   BUN 12 02/23/2022 0934   CREATININE 0.56 02/23/2022 0934   CALCIUM  9.3 02/23/2022 0934    Lipid Panel     Component Value Date/Time   CHOL 234 (H) 02/23/2022 0934   TRIG 237 (H) 02/23/2022 0934   HDL 49 (L) 02/23/2022 0934   CHOLHDL 4.8 02/23/2022 0934   LDLCALC 147 (H) 02/23/2022 0934    CBC    Component Value Date/Time   WBC 7.5 02/23/2022 0934   RBC 4.46 02/23/2022 0934   HGB 13.1 02/23/2022 0934   HCT 39.4 02/23/2022 0934   PLT 291 02/23/2022 0934   MCV 88.3 02/23/2022 0934   MCH 29.4 02/23/2022 0934   MCHC 33.2 02/23/2022 0934   RDW 12.7 02/23/2022 0934    Hgb A1C Lab Results  Component Value Date   HGBA1C 5.7 (H) 02/23/2022           Assessment & Plan:   Assessment and Plan    Dental abscess, left lower jaw Tenderness in the left lower jaw due to a broken crown with gum irritation, likely from an abscess. Smoking may aggravate the condition. -  Prescribe amoxicillin  875 mg twice a day for 10 days. - Advise salt water gargles. - Instruct to follow up with dentist.  Dyspepsia Intermittent chest tightness and burning sensation likely related to digestive issues. Symptoms occur when eating, suggesting a digestive etiology. - Recommend Prilosec 20 mg daily for two weeks. - Advise to monitor symptoms and report if she does not improve.  Genital herpes, recurrent outbreak Possible recurrent outbreak of genital herpes. Lesion appears irritated, unclear if it is a herpes lesion. Stress may contribute. - Prescribe Valtrex  1 gram twice a day for 7 days. - Advise to monitor the lesion and report if it does not improve.  Superficial wound of bilateral knees Superficial wounds on both knees from a fall six days ago. Wounds are sore but not infected. Healing is slow with stinging and burning sensations. - Advise to clean wounds with soap and water, pat dry, and apply Neosporin twice a day.        RTC in 1 week for your annual exam Angeline Laura, NP

## 2023-09-20 NOTE — Patient Instructions (Signed)

## 2023-09-24 ENCOUNTER — Encounter: Admitting: Internal Medicine

## 2023-10-22 ENCOUNTER — Encounter: Payer: Self-pay | Admitting: Internal Medicine

## 2023-10-22 ENCOUNTER — Ambulatory Visit (INDEPENDENT_AMBULATORY_CARE_PROVIDER_SITE_OTHER): Admitting: Internal Medicine

## 2023-10-22 VITALS — BP 112/78 | Ht 65.0 in | Wt 213.0 lb

## 2023-10-22 DIAGNOSIS — Z79899 Other long term (current) drug therapy: Secondary | ICD-10-CM | POA: Diagnosis not present

## 2023-10-22 DIAGNOSIS — Z0001 Encounter for general adult medical examination with abnormal findings: Secondary | ICD-10-CM

## 2023-10-22 DIAGNOSIS — E66812 Obesity, class 2: Secondary | ICD-10-CM

## 2023-10-22 DIAGNOSIS — E782 Mixed hyperlipidemia: Secondary | ICD-10-CM

## 2023-10-22 DIAGNOSIS — R7303 Prediabetes: Secondary | ICD-10-CM | POA: Diagnosis not present

## 2023-10-22 DIAGNOSIS — Z1231 Encounter for screening mammogram for malignant neoplasm of breast: Secondary | ICD-10-CM

## 2023-10-22 DIAGNOSIS — Z6835 Body mass index (BMI) 35.0-35.9, adult: Secondary | ICD-10-CM

## 2023-10-22 DIAGNOSIS — N951 Menopausal and female climacteric states: Secondary | ICD-10-CM

## 2023-10-22 LAB — COMPREHENSIVE METABOLIC PANEL WITH GFR
AG Ratio: 2 (calc) (ref 1.0–2.5)
ALT: 20 U/L (ref 6–29)
AST: 16 U/L (ref 10–35)
Albumin: 4.9 g/dL (ref 3.6–5.1)
Alkaline phosphatase (APISO): 70 U/L (ref 37–153)
BUN: 13 mg/dL (ref 7–25)
CO2: 29 mmol/L (ref 20–32)
Calcium: 9.6 mg/dL (ref 8.6–10.4)
Chloride: 100 mmol/L (ref 98–110)
Creat: 0.57 mg/dL (ref 0.50–1.03)
Globulin: 2.5 g/dL (ref 1.9–3.7)
Glucose, Bld: 129 mg/dL (ref 65–139)
Potassium: 4.4 mmol/L (ref 3.5–5.3)
Sodium: 137 mmol/L (ref 135–146)
Total Bilirubin: 0.3 mg/dL (ref 0.2–1.2)
Total Protein: 7.4 g/dL (ref 6.1–8.1)
eGFR: 111 mL/min/1.73m2 (ref >=60)

## 2023-10-22 LAB — HEMOGLOBIN A1C
Hgb A1c MFr Bld: 5.8 % — ABNORMAL HIGH (ref <5.7)
Mean Plasma Glucose: 120 mg/dL
eAG (mmol/L): 6.6 mmol/L

## 2023-10-22 LAB — LIPID PANEL
Cholesterol: 246 mg/dL — ABNORMAL HIGH (ref <200)
HDL: 45 mg/dL — ABNORMAL LOW (ref >=50)
LDL Cholesterol (Calc): 143 mg/dL — ABNORMAL HIGH
Non-HDL Cholesterol (Calc): 201 mg/dL — ABNORMAL HIGH (ref <130)
Total CHOL/HDL Ratio: 5.5 (calc) — ABNORMAL HIGH (ref <5.0)
Triglycerides: 374 mg/dL — ABNORMAL HIGH (ref <150)

## 2023-10-22 LAB — CBC
HCT: 41.5 % (ref 35.0–45.0)
Hemoglobin: 13.6 g/dL (ref 11.7–15.5)
MCH: 28.8 pg (ref 27.0–33.0)
MCHC: 32.8 g/dL (ref 32.0–36.0)
MCV: 87.7 fL (ref 80.0–100.0)
MPV: 10.3 fL (ref 7.5–12.5)
Platelets: 286 Thousand/uL (ref 140–400)
RBC: 4.73 Million/uL (ref 3.80–5.10)
RDW: 12.4 % (ref 11.0–15.0)
WBC: 8 Thousand/uL (ref 3.8–10.8)

## 2023-10-22 LAB — FSH/LH
FSH: 95.7 m[IU]/mL
LH: 46.2 m[IU]/mL

## 2023-10-22 LAB — TSH: TSH: 0.75 m[IU]/L

## 2023-10-22 NOTE — Assessment & Plan Note (Signed)
 Encourage diet and exercise for weight loss

## 2023-10-22 NOTE — Patient Instructions (Signed)

## 2023-10-22 NOTE — Progress Notes (Signed)
 Subjective:    Patient ID: Regina Schneider, female    DOB: Oct 06, 1975, 48 y.o.   MRN: [id number]  HPI  Patient presents to clinic today for annual exam.   She experiences frequent hot flashes that have intensified recently, described as sudden surges of heat causing profuse sweating within minutes. These episodes occur during the day. She has not had a menstrual period in eight to nine years following an endometrial ablation.  She reports increased thirst and urination, leg cramps, and fatigue. Despite a decreased appetite, she has noticed slight weight gain. She has a history of prediabetes and is concerned about her blood sugar levels, especially since these symptoms seem to occur after eating. She recalls a friend with similar symptoms who was diagnosed with a blood sugar issue.  She has been waking up more frequently at night, which she associates with menopause. She is not currently on any medications but previously took Lexapro , which she stopped after it made her feel depressed following her mother's passing.       Flu: 10/2018 Tetanus: 02/2018 COVID: Paulino x 1 Shingrix: never Pap smear: 05/2019 Mammogram: 2019, scheduled 04/2022 Colon screening: 03/2022, Cologuard Vision screening: annually Dentist: biannually  Diet: She does eat meat. She consumes fruits and veggies. She tries to avoid fried foods. She drinks mostly coffee, water, unsweet tea or Dt. Soda. Exercise: Walking  Review of Systems     Past Medical History:  Diagnosis Date   Anxiety    Kidney stones    Kidney stones    Sleep apnea     Current Outpatient Medications  Medication Sig Dispense Refill   ibuprofen  (ADVIL ) 600 MG tablet Take 1 tablet (600 mg total) by mouth every 8 (eight) hours as needed. 30 tablet 0   omeprazole  (PRILOSEC) 20 MG capsule Take 1 capsule (20 mg total) by mouth daily. 14 capsule 0   No current facility-administered medications for this visit.    Allergies  Allergen  Reactions   Ciprofloxacin      Hot flashes and abd pain   Sulfa Antibiotics Hives    Family History  Problem Relation Age of Onset   COPD Mother    Dementia Mother    Cancer Father        lung and brain stem   COPD Father    Hypertension Sister    Breast cancer Maternal Grandmother        breast cancer, 50   Lung cancer Maternal Grandmother    Diabetes Paternal Grandmother     Social History   Socioeconomic History   Marital status: Married    Spouse name: Not on file   Number of children: Not on file   Years of education: Not on file   Highest education level: Not on file  Occupational History   Not on file  Tobacco Use   Smoking status: Every Day    Current packs/day: 0.00    Types: Cigarettes    Last attempt to quit: 12/02/2018    Years since quitting: 4.8   Smokeless tobacco: Never   Tobacco comments:    3-4 cigarettes a day  Vaping Use   Vaping status: Never Used  Substance and Sexual Activity   Alcohol use: Yes    Alcohol/week: 1.0 standard drink of alcohol    Types: 1 Glasses of wine per week    Comment: twice a week   Drug use: Never   Sexual activity: Yes    Birth control/protection: None  Other  Topics Concern   Not on file  Social History Narrative   Not on file   Social Drivers of Health   Financial Resource Strain: Not on file  Food Insecurity: Not on file  Transportation Needs: Not on file  Physical Activity: Not on file  Stress: Not on file  Social Connections: Not on file  Intimate Partner Violence: Not on file     Constitutional: Pt reports fatigue. Denies fever, malaise, headache or abrupt weight changes.  HEENT: Denies eye pain, eye redness, ear pain, ringing in the ears, wax buildup, runny nose, nasal congestion, bloody nose, or sore throat. Respiratory: Denies difficulty breathing, shortness of breath, cough or sputum production.   Cardiovascular: Denies chest pain, chest tightness, palpitations or swelling in the hands or feet.   Gastrointestinal: Pt reports increased thirst. Denies abdominal pain, bloating, constipation, diarrhea or blood in the stool.  GU: Pt reports amenorrhea, urinary frequency. Denies urgency, frequency, pain with urination, burning sensation, blood in urine, odor or discharge. Musculoskeletal: Pt reports leg cramps. Denies decrease in range of motion, difficulty with gait, muscle pain or joint pain and swelling.  Skin: Denies redness, rashes, lesions or ulcercations.  Neurological: Pt reports hot flashes. Denies dizziness, difficulty with memory, difficulty with speech or problems with balance and coordination.  Psych: Patient has a history of anxiety.  Denies depression, SI/HI.  No other specific complaints in a complete review of systems (except as listed in HPI above).  Objective:   Physical Exam  BP 112/78 (BP Location: Left Arm, Patient Position: Sitting, Cuff Size: Normal)   Ht 5' 5 (1.651 m)   Wt 213 lb (96.6 kg)   BMI 35.45 kg/m   Wt Readings from Last 3 Encounters:  09/20/23 209 lb 9.6 oz (95.1 kg)  07/23/23 190 lb (86.2 kg)  11/01/22 201 lb 2 oz (91.2 kg)    General: Appears her stated age, obese, in NAD. Skin: Warm, dry and intact.  HEENT: Head: normal shape and size; Eyes: sclera white, no icterus, conjunctiva pink, PERRLA and EOMs intact;  Neck:  Neck supple, trachea midline. No masses, lumps or thyromegaly present.  Cardiovascular: Normal rate and rhythm. S1,S2 noted.  No murmur, rubs or gallops noted. No JVD or BLE edema.  Pulmonary/Chest: Normal effort and positive vesicular breath sounds. No respiratory distress. No wheezes, rales or ronchi noted.  Abdomen: Soft and nontender. Normal bowel sounds.  Musculoskeletal: Strength 5/5 BUE/BLE. No difficulty with gait.  Neurological: Alert and oriented. Cranial nerves II-XII grossly intact. Coordination normal.  Psychiatric: Mood and affect normal. Behavior is normal. Judgment and thought content normal.    BMET     Component Value Date/Time   NA 139 02/23/2022 0934   K 4.3 02/23/2022 0934   CL 104 02/23/2022 0934   CO2 27 02/23/2022 0934   GLUCOSE 89 02/23/2022 0934   BUN 12 02/23/2022 0934   CREATININE 0.56 02/23/2022 0934   CALCIUM  9.3 02/23/2022 0934    Lipid Panel     Component Value Date/Time   CHOL 234 (H) 02/23/2022 0934   TRIG 237 (H) 02/23/2022 0934   HDL 49 (L) 02/23/2022 0934   CHOLHDL 4.8 02/23/2022 0934   LDLCALC 147 (H) 02/23/2022 0934    CBC    Component Value Date/Time   WBC 7.5 02/23/2022 0934   RBC 4.46 02/23/2022 0934   HGB 13.1 02/23/2022 0934   HCT 39.4 02/23/2022 0934   PLT 291 02/23/2022 0934   MCV 88.3 02/23/2022 0934   MCH  29.4 02/23/2022 0934   MCHC 33.2 02/23/2022 0934   RDW 12.7 02/23/2022 0934    Hgb A1C Lab Results  Component Value Date   HGBA1C 5.7 (H) 02/23/2022           Assessment & Plan:   Preventative Health Maintenance:  Flu shot declined Tetanus UTD Encouraged her to get her COVID booster Discussed Shingrix vaccine, she will check coverage with her insurance company schedule visit if she would like to have this done Pap smear due, she will call GYN Mammogram ordered, she will call to schedule Cologuard UTD Encouraged her to consume a balanced diet and exercise regimen Advised her to see an eye doctor and dentist annually We will check CBC, c-Met, lipid, A1c today  Perimenopause symptoms:  Will check TSH, FSH and LH today  RTC in 6 months, follow-up chronic conditions Angeline Laura, NP

## 2023-10-23 ENCOUNTER — Ambulatory Visit: Payer: Self-pay | Admitting: Internal Medicine

## 2023-10-23 DIAGNOSIS — E782 Mixed hyperlipidemia: Secondary | ICD-10-CM

## 2023-10-23 MED ORDER — ATORVASTATIN CALCIUM 10 MG PO TABS: 10.0000 mg | ORAL_TABLET | Freq: Every day | ORAL | 1 refills | Status: AC

## 2023-11-14 ENCOUNTER — Encounter: Admitting: Internal Medicine

## 2024-04-20 ENCOUNTER — Ambulatory Visit: Admitting: Internal Medicine
